# Patient Record
Sex: Female | Born: 1975
Health system: Southern US, Community
[De-identification: ages and names within clinical notes are randomized; demographics above are authoritative.]

## PROBLEM LIST (undated history)

## (undated) DIAGNOSIS — B009 Herpesviral infection, unspecified: Secondary | ICD-10-CM

## (undated) DIAGNOSIS — G894 Chronic pain syndrome: Secondary | ICD-10-CM

## (undated) DIAGNOSIS — E282 Polycystic ovarian syndrome: Secondary | ICD-10-CM

## (undated) DIAGNOSIS — K219 Gastro-esophageal reflux disease without esophagitis: Secondary | ICD-10-CM

## (undated) DIAGNOSIS — R253 Fasciculation: Secondary | ICD-10-CM

## (undated) DIAGNOSIS — E785 Hyperlipidemia, unspecified: Secondary | ICD-10-CM

## (undated) DIAGNOSIS — G4730 Sleep apnea, unspecified: Secondary | ICD-10-CM

## (undated) DIAGNOSIS — F419 Anxiety disorder, unspecified: Secondary | ICD-10-CM

## (undated) DIAGNOSIS — M797 Fibromyalgia: Secondary | ICD-10-CM

## (undated) DIAGNOSIS — I10 Essential (primary) hypertension: Secondary | ICD-10-CM

## (undated) DIAGNOSIS — Z8489 Family history of other specified conditions: Secondary | ICD-10-CM

## (undated) HISTORY — DX: Hyperlipidemia, unspecified: E78.5

## (undated) HISTORY — PX: COLONOSCOPY: SHX174

## (undated) HISTORY — DX: Fasciculation: R25.3

## (undated) HISTORY — DX: Fibromyalgia: M79.7

## (undated) HISTORY — DX: Herpesviral infection, unspecified: B00.9

## (undated) HISTORY — PX: KNEE SURGERY: SHX244

## (undated) HISTORY — PX: DILATION AND CURETTAGE OF UTERUS: SHX78

## (undated) HISTORY — DX: Gastro-esophageal reflux disease without esophagitis: K21.9

## (undated) HISTORY — PX: MOUTH SURGERY: SHX715

---

## 2014-02-23 ENCOUNTER — Other Ambulatory Visit: Payer: Self-pay | Admitting: Obstetrics & Gynecology

## 2014-02-23 ENCOUNTER — Other Ambulatory Visit (HOSPITAL_COMMUNITY)
Admission: RE | Admit: 2014-02-23 | Discharge: 2014-02-23 | Disposition: A | Discharge status: Home / Self Care | Payer: Federal, State, Local not specified - PPO | Source: Ambulatory Visit | Attending: Obstetrics & Gynecology | Admitting: Obstetrics & Gynecology

## 2014-02-23 DIAGNOSIS — Z01419 Encounter for gynecological examination (general) (routine) without abnormal findings: Secondary | ICD-10-CM | POA: Insufficient documentation

## 2014-02-23 DIAGNOSIS — Z1151 Encounter for screening for human papillomavirus (HPV): Secondary | ICD-10-CM | POA: Insufficient documentation

## 2015-02-19 ENCOUNTER — Other Ambulatory Visit (HOSPITAL_COMMUNITY)
Admission: RE | Admit: 2015-02-19 | Discharge: 2015-02-19 | Disposition: A | Discharge status: Home / Self Care | Payer: Federal, State, Local not specified - PPO | Source: Ambulatory Visit | Attending: Obstetrics & Gynecology | Admitting: Obstetrics & Gynecology

## 2015-02-19 ENCOUNTER — Other Ambulatory Visit: Payer: Self-pay | Admitting: Obstetrics & Gynecology

## 2015-02-19 DIAGNOSIS — Z113 Encounter for screening for infections with a predominantly sexual mode of transmission: Secondary | ICD-10-CM | POA: Diagnosis present

## 2015-02-19 DIAGNOSIS — Z01419 Encounter for gynecological examination (general) (routine) without abnormal findings: Secondary | ICD-10-CM | POA: Insufficient documentation

## 2015-02-22 LAB — CYTOLOGY - PAP

## 2015-09-29 ENCOUNTER — Other Ambulatory Visit: Payer: Self-pay | Admitting: Family Medicine

## 2015-09-29 DIAGNOSIS — R1011 Right upper quadrant pain: Secondary | ICD-10-CM

## 2015-09-29 DIAGNOSIS — K8020 Calculus of gallbladder without cholecystitis without obstruction: Secondary | ICD-10-CM

## 2015-10-01 ENCOUNTER — Ambulatory Visit
Admission: RE | Admit: 2015-10-01 | Discharge: 2015-10-01 | Disposition: A | Discharge status: Home / Self Care | Payer: Federal, State, Local not specified - PPO | Source: Ambulatory Visit | Attending: Family Medicine | Admitting: Family Medicine

## 2015-10-01 DIAGNOSIS — K8020 Calculus of gallbladder without cholecystitis without obstruction: Secondary | ICD-10-CM

## 2015-10-01 DIAGNOSIS — R1011 Right upper quadrant pain: Secondary | ICD-10-CM

## 2016-05-03 ENCOUNTER — Encounter (HOSPITAL_BASED_OUTPATIENT_CLINIC_OR_DEPARTMENT_OTHER): Payer: Self-pay

## 2016-05-03 ENCOUNTER — Emergency Department (HOSPITAL_BASED_OUTPATIENT_CLINIC_OR_DEPARTMENT_OTHER)

## 2016-05-03 ENCOUNTER — Emergency Department (HOSPITAL_BASED_OUTPATIENT_CLINIC_OR_DEPARTMENT_OTHER)
Admission: EM | Admit: 2016-05-03 | Discharge: 2016-05-03 | Disposition: A | Discharge status: Home / Self Care | Attending: Emergency Medicine | Admitting: Emergency Medicine

## 2016-05-03 DIAGNOSIS — Y9389 Activity, other specified: Secondary | ICD-10-CM | POA: Diagnosis not present

## 2016-05-03 DIAGNOSIS — W1839XA Other fall on same level, initial encounter: Secondary | ICD-10-CM | POA: Diagnosis not present

## 2016-05-03 DIAGNOSIS — S8992XA Unspecified injury of left lower leg, initial encounter: Secondary | ICD-10-CM | POA: Diagnosis present

## 2016-05-03 DIAGNOSIS — Z79899 Other long term (current) drug therapy: Secondary | ICD-10-CM | POA: Diagnosis not present

## 2016-05-03 DIAGNOSIS — Z87891 Personal history of nicotine dependence: Secondary | ICD-10-CM | POA: Insufficient documentation

## 2016-05-03 DIAGNOSIS — M25562 Pain in left knee: Secondary | ICD-10-CM

## 2016-05-03 DIAGNOSIS — Y92520 Airport as the place of occurrence of the external cause: Secondary | ICD-10-CM | POA: Insufficient documentation

## 2016-05-03 DIAGNOSIS — Y990 Civilian activity done for income or pay: Secondary | ICD-10-CM | POA: Insufficient documentation

## 2016-05-03 HISTORY — DX: Polycystic ovarian syndrome: E28.2

## 2016-05-03 HISTORY — DX: Chronic pain syndrome: G89.4

## 2016-05-03 HISTORY — DX: Anxiety disorder, unspecified: F41.9

## 2016-05-03 MED ORDER — OXYCODONE-ACETAMINOPHEN 5-325 MG PO TABS: 1.0000 | ORAL_TABLET | ORAL | Status: DC | PRN

## 2016-05-03 MED ORDER — OXYCODONE-ACETAMINOPHEN 5-325 MG PO TABS: 1.0000 | ORAL_TABLET | Freq: Once | ORAL | Status: DC

## 2016-05-03 MED ORDER — IBUPROFEN 400 MG PO TABS: 400.0000 mg | ORAL_TABLET | Freq: Once | ORAL | Status: DC

## 2016-05-03 MED FILL — OXYCODONE/APAP 5-325: 5-325 | 1 days supply | Qty: 6 | Fill #0

## 2016-05-03 NOTE — ED Notes (Signed)
Returned from xray

## 2016-05-03 NOTE — ED Notes (Signed)
Left knee injury x 2 at work-first when lifting car seat-second when squatting to standing and heard a pop-hx of surgery to knee 8 yrs ago-presents to triage in w/c

## 2016-05-03 NOTE — Discharge Instructions (Signed)

## 2016-05-03 NOTE — ED Notes (Signed)
Patient transported to X-ray 

## 2016-05-09 ENCOUNTER — Emergency Department (HOSPITAL_BASED_OUTPATIENT_CLINIC_OR_DEPARTMENT_OTHER)
Admission: EM | Admit: 2016-05-09 | Discharge: 2016-05-09 | Disposition: A | Discharge status: Home / Self Care | Attending: Emergency Medicine | Admitting: Emergency Medicine

## 2016-05-09 ENCOUNTER — Encounter (HOSPITAL_BASED_OUTPATIENT_CLINIC_OR_DEPARTMENT_OTHER): Payer: Self-pay | Admitting: *Deleted

## 2016-05-09 DIAGNOSIS — Z87891 Personal history of nicotine dependence: Secondary | ICD-10-CM | POA: Diagnosis not present

## 2016-05-09 DIAGNOSIS — Y929 Unspecified place or not applicable: Secondary | ICD-10-CM | POA: Diagnosis not present

## 2016-05-09 DIAGNOSIS — X58XXXA Exposure to other specified factors, initial encounter: Secondary | ICD-10-CM | POA: Diagnosis not present

## 2016-05-09 DIAGNOSIS — Y990 Civilian activity done for income or pay: Secondary | ICD-10-CM | POA: Insufficient documentation

## 2016-05-09 DIAGNOSIS — S8992XD Unspecified injury of left lower leg, subsequent encounter: Secondary | ICD-10-CM | POA: Diagnosis not present

## 2016-05-09 DIAGNOSIS — Y939 Activity, unspecified: Secondary | ICD-10-CM | POA: Insufficient documentation

## 2016-05-09 DIAGNOSIS — M25562 Pain in left knee: Secondary | ICD-10-CM | POA: Diagnosis present

## 2016-05-09 NOTE — ED Notes (Signed)
Pt requests to see PA prior to D/c. Will make aware.

## 2016-05-09 NOTE — ED Notes (Signed)
[place]ie PA in to see pt.

## 2016-05-09 NOTE — ED Provider Notes (Signed)
CSN: [id number]     Arrival date & time 05/09/16  1031 History   First MD Initiated Contact with Patient 05/09/16 1043     No chief complaint on file.    (Consider location/radiation/quality/duration/timing/severity/associated sxs/prior Treatment) HPI   40 year old female with history of chronic pain syndrome, anxiety, presenting for evaluation of left knee pain. Patient injured her left knee at work a week ago when she went from a squatting position to a standing position heard a pop with acute pain to her left knee. She was seen in the ED and an x-ray of her knee shows no acute fractures or dislocation. She was recommended to follow-up with a specialist for further evaluation of her left knee injury as it could be an internal derangement. A worker's comp sheet was filled as well as a work note provided. Patient presenting today stating that she is having difficulty with follow-up by his specialist due to "insufficient information on the worker's comp" sheet.  She is requesting for the note to be filled to the specification of her work's request.  She continues to endorse sharp throbbing pain to L knee worsening with walking up the steps and with movement.  She report knee instability.  She has been using her knee immobilizer and crutches as prescribed.  She has not been evaluated by a specialist yet.  She has had L knee surgery in the past by Dr. Yisroel Ramming.  She denies pain to L hip or L ankle.  Pt report she developed reaction to the oxycodone prescribed during the past visits and currently taking OTC meds to alleviate her pain.     Past Medical History  Diagnosis Date  . PCOS (polycystic ovarian syndrome)   . Chronic pain syndrome   . Anxiety    Past Surgical History  Procedure Laterality Date  . Knee surgery    . Mouth surgery    . Dilation and curettage of uterus     No family history on file. Social History  Substance Use Topics  . Smoking status: Former Games developer  . Smokeless tobacco:  Not on file  . Alcohol Use: No   OB History    No data available     Review of Systems  Constitutional: Negative for fever.  Musculoskeletal: Positive for arthralgias.  Skin: Negative for rash and wound.  Neurological: Negative for numbness.      Allergies  Review of patient's allergies indicates no known allergies.  Home Medications   Prior to Admission medications   Medication Sig Start Date End Date Taking? Authorizing Provider  Carisoprodol (SOMA PO) Take by mouth.    Historical Provider, MD  ClonazePAM (KLONOPIN PO) Take by mouth.    Historical Provider, MD  oxyCODONE-acetaminophen (PERCOCET/ROXICET) 5-325 MG tablet Take 1 tablet by mouth every 4 (four) hours as needed for severe pain. 05/03/16   Raeford Razor, MD  TIZANIDINE HCL PO Take by mouth.    Historical Provider, MD   BP 133/67 mmHg  Pulse 91  Temp(Src) 98.6 F (37 C) (Oral)  Resp 18  Ht  (1.702 m)  Wt 108.863 kg  BMI 37.58 kg/m2  SpO2 98% Physical Exam  Constitutional: She appears well-developed and well-nourished. No distress.  HENT:  Head: Atraumatic.  Eyes: Conjunctivae are normal.  Neck: Neck supple.  Cardiovascular: Intact distal pulses.   Musculoskeletal: She exhibits tenderness (L knee: tendernss to medial and lateral joint line on palpation.  negative anterior/posterior drawer test.  pain with varus/valgus maneuver.  no swelling  noted and no deformity. ).  Neurological: She is alert.  Skin: No rash noted.  Psychiatric: She has a normal mood and affect.  Nursing note and vitals reviewed.   ED Course  Procedures (including critical care time)   MDM   Final diagnoses:  Left knee injury, subsequent encounter    BP 133/67 mmHg  Pulse 91  Temp(Src) 98.6 F (37 C) (Oral)  Resp 18  Ht 5\' 7"  (1.702 m)  Wt 108.863 kg  BMI 37.58 kg/m2  SpO2 98%   11:09 AM Pt here requesting for me to talk to her HR and to fill out the appropriate form in order for her to get further care for her  recent L knee injury.  Her xray was negative.  Suspect internal derangement of the knee.  I did talk to her HR person on the phone.  A form was faxed to me to fill, specify her work restriction.  Pt can work in a seated position for a duration of 1 week and she will need to f/u with an orthopedist specialist for further management of care.  Care discussed with Dr. Verdie MosherLiu.  Fayrene HelperBowie Josey Forcier, PA-C 05/09/16 1205  Lavera Guiseana Duo Liu, MD 05/09/16 Windy [id number]

## 2016-05-09 NOTE — ED Notes (Addendum)
Was seen here before for same  On May 31 for work related injury to left knee. Is here now because she states paperwork and work note were not filled out correctly.Pt is very anxious.  Also c/o that left knee has excrutiating pain and arrives in knee immobilizer.

## 2016-05-09 NOTE — Discharge Instructions (Signed)
Please follow up with an orthopedist specialist for further evaluation of your left knee injury as this may be an internal derangement of your knee (meniscal tear, ligament injury, etc...).  Continue to use over the counter tylenol/ibuprofen for pain.  Use crutches and knee immobilization as provided.  Knee Pain Knee pain is a common problem. It can have many causes. The pain often goes away by following your doctor's home care instructions. Treatment for ongoing pain will depend on the cause of your pain. If your knee pain continues, more tests may be needed to diagnose your condition. Tests may include X-rays or other imaging studies of your knee. HOME CARE  Take medicines only as told by your doctor.  Rest your knee and keep it raised (elevated) while you are resting.  Do not do things that cause pain or make your pain worse.  Avoid activities where both feet leave the ground at the same time, such as running, jumping rope, or doing jumping jacks.  Apply ice to the knee area:  Put ice in a plastic bag.  Place a towel between your skin and the bag.  Leave the ice on for 20 minutes, 2-3 times a day.  Ask your doctor if you should wear an elastic knee support.  Sleep with a pillow under your knee.  Lose weight if you are overweight. Being overweight can make your knee hurt more.  Do not use any tobacco products, including cigarettes, chewing tobacco, or electronic cigarettes. If you need help quitting, ask your doctor. Smoking may slow the healing of any bone and joint problems that you may have. GET HELP IF:  Your knee pain does not stop, it changes, or it gets worse.  You have a fever along with knee pain.  Your knee gives out or locks up.  Your knee becomes more swollen. GET HELP RIGHT AWAY IF:   Your knee feels hot to the touch.  You have chest pain or trouble breathing.   This information is not intended to replace advice given to you by your health care provider.  Make sure you discuss any questions you have with your health care provider.   Document Released: 02/16/2009 Document Revised: 12/11/2014 Document Reviewed: 01/21/2014 Elsevier Interactive Patient Education [employer]2016 Elsevier Inc.

## 2016-05-15 NOTE — ED Provider Notes (Signed)
CSN: [id number]     Arrival date & time 05/03/16  1355 History   First MD Initiated Contact with Patient 05/03/16 1531     Chief Complaint  Patient presents with  . Knee Injury     (Consider location/radiation/quality/duration/timing/severity/associated sxs/prior Treatment) HPI   40 year old female with left knee pain. Injured it [place]. She reports that she was carrying a baby's car seat when part of it fell and struck her in the left knee. She had pain since that time but was able to continue working. Later she bent down to a crossing position and then as she stood back up she felt a "pop" in her left knee and said severe persistent knee pain since then to the point where she cannot fully bear weight. Denies any acute pain elsewhere. No numbness or tingling. Requesting Worker's Compensation paperwork be filled out. "My supervisor told me that this was the 'golden ticket'" while referring to this paperwork.   Past Medical History  Diagnosis Date  . PCOS (polycystic ovarian syndrome)   . Chronic pain syndrome   . Anxiety    Past Surgical History  Procedure Laterality Date  . Knee surgery    . Mouth surgery    . Dilation and curettage of uterus     No family history on file. Social History  Substance Use Topics  . Smoking status: Former Games developer  . Smokeless tobacco: None  . Alcohol Use: No   OB History    No data available     Review of Systems  All systems reviewed and negative, other than as noted in HPI.   Allergies  Review of patient's allergies indicates no known allergies.  Home Medications   Prior to Admission medications   Medication Sig Start Date End Date Taking? Authorizing Provider  Carisoprodol (SOMA PO) Take by mouth.   Yes Historical Provider, MD  ClonazePAM (KLONOPIN PO) Take by mouth.   Yes Historical Provider, MD  TIZANIDINE HCL PO Take by mouth.   Yes Historical Provider, MD  oxyCODONE-acetaminophen (PERCOCET/ROXICET)  5-325 MG tablet Take 1 tablet by mouth every 4 (four) hours as needed for severe pain. 05/03/16   Raeford Razor, MD   BP 130/86 mmHg  Pulse 84  Temp(Src) 99.1 F (37.3 C) (Oral)  Resp 18  Ht  (1.702 m)  Wt 240 lb (108.863 kg)  BMI 37.58 kg/m2  SpO2 100%  LMP  Physical Exam  Constitutional: She appears well-developed and well-nourished. No distress.  HENT:  Head: Normocephalic and atraumatic.  Eyes: Conjunctivae are normal. Right eye exhibits no discharge. Left eye exhibits no discharge.  Neck: Neck supple.  Cardiovascular: Normal rate, regular rhythm and normal heart sounds.  Exam reveals no gallop and no friction rub.   No murmur heard. Pulmonary/Chest: Effort normal and breath sounds normal. No respiratory distress.  Abdominal: Soft. She exhibits no distension. There is no tenderness.  Musculoskeletal: She exhibits no tenderness.  L knee unremarkable in appearance. No swelling/effusion. Diffusely tender. Increased pain with ROM. NVI.   Neurological: She is alert.  Skin: Skin is warm and dry.  Psychiatric: She has a normal mood and affect. Her behavior is normal. Thought content normal.  Nursing note and vitals reviewed.   ED Course  Procedures (including critical care time) Labs Review Labs Reviewed - No data to display  Imaging Review No results found.   Dg Knee Complete 4 Views Left  05/03/2016  CLINICAL DATA:  Left knee pain  after injury at work. Unable to bear weight. EXAM: LEFT KNEE - COMPLETE 4+ VIEW COMPARISON:  None. FINDINGS: Minimal degenerative changes most prominent over the medial compartment. No evidence of acute fracture dislocation. No significant joint effusion. IMPRESSION: No acute findings. Electronically Signed   By: Elberta Fortisaniel  Boyle M.D.   On: 05/03/2016 14:34   I have personally reviewed and evaluated these images and lab results as part of my medical decision-making.   EKG Interpretation None      MDM   Final diagnoses:  Left knee pain     40yF with L knee pain. Worker's comp. Imaging negative. NVI. I cannot r/o internal derangement. Placed in immobilizer. Crutches. Ortho FU. I am fine with her working in a seated position.     Raeford RazorStephen Kingslee Mairena, MD 05/15/16 [phone]

## 2017-02-27 ENCOUNTER — Other Ambulatory Visit: Payer: Self-pay | Admitting: Obstetrics & Gynecology

## 2017-03-21 ENCOUNTER — Encounter (HOSPITAL_BASED_OUTPATIENT_CLINIC_OR_DEPARTMENT_OTHER): Payer: Self-pay | Admitting: Emergency Medicine

## 2017-03-21 ENCOUNTER — Emergency Department (HOSPITAL_BASED_OUTPATIENT_CLINIC_OR_DEPARTMENT_OTHER)
Admission: EM | Admit: 2017-03-21 | Discharge: 2017-03-21 | Disposition: A | Discharge status: Home / Self Care | Payer: Federal, State, Local not specified - PPO | Attending: Emergency Medicine | Admitting: Emergency Medicine

## 2017-03-21 DIAGNOSIS — Z79899 Other long term (current) drug therapy: Secondary | ICD-10-CM | POA: Insufficient documentation

## 2017-03-21 DIAGNOSIS — H9201 Otalgia, right ear: Secondary | ICD-10-CM | POA: Diagnosis present

## 2017-03-21 DIAGNOSIS — Z87891 Personal history of nicotine dependence: Secondary | ICD-10-CM | POA: Insufficient documentation

## 2017-03-21 DIAGNOSIS — H60311 Diffuse otitis externa, right ear: Secondary | ICD-10-CM | POA: Diagnosis not present

## 2017-03-21 MED ORDER — HYDROCODONE-ACETAMINOPHEN 5-325 MG PO TABS: 2.0000 | ORAL_TABLET | Freq: Four times a day (QID) | ORAL | 0 refills | Status: DC | PRN

## 2017-03-21 MED ORDER — CIPROFLOXACIN-DEXAMETHASONE 0.3-0.1 % OT SUSP: 4.0000 [drp] | Freq: Two times a day (BID) | OTIC | 0 refills | Status: DC

## 2017-03-21 NOTE — Discharge Instructions (Signed)
You may alternate Tylenol 1000 mg every 6 hours as needed for pain and Ibuprofen 800 mg every 8 hours as needed for pain.  Please take Ibuprofen with food.   Please note that Vicodin has 325 mg of Tylenol in each tablet. You should not take more than 4000 mg of Tylenol in a 24-hour period.

## 2017-03-21 NOTE — ED Notes (Signed)
c/o rt ear pain after plane flight on April 14, rt ear canal swollen  Diff hearing and painful,  denies drainage

## 2017-03-21 NOTE — ED Provider Notes (Signed)
TIME SEEN: 4:38 AM  CHIEF COMPLAINT: Right ear pain  HPI: Patient is a 41 year old female with history of PCO S who presents emergency department with right ear pain for the past 2 weeks. Progressively worsening. No drainage. Reports that her hearing some small full from this ear. No injury to the ear. States it all started after a plane ride. She has not been swimming recently. She is not a diabetic.  ROS: See HPI Constitutional: no fever  Eyes: no drainage  ENT: no runny nose   Cardiovascular:  no chest pain  Resp: no SOB  GI: no vomiting GU: no dysuria Integumentary: no rash  Allergy: no hives  Musculoskeletal: no leg swelling  Neurological: no slurred speech ROS otherwise negative  PAST MEDICAL HISTORY/PAST SURGICAL HISTORY:  Past Medical History:  Diagnosis Date  . Anxiety   . Chronic pain syndrome   . PCOS (polycystic ovarian syndrome)     MEDICATIONS:  Prior to Admission medications   Medication Sig Start Date End Date Taking? Authorizing Provider  Carisoprodol (SOMA PO) Take by mouth.    Historical Provider, MD  ClonazePAM (KLONOPIN PO) Take by mouth.    Historical Provider, MD  oxyCODONE-acetaminophen (PERCOCET/ROXICET) 5-325 MG tablet Take 1 tablet by mouth every 4 (four) hours as needed for severe pain. 05/03/16   Raeford Razor, MD  TIZANIDINE HCL PO Take by mouth.    Historical Provider, MD    ALLERGIES:  Allergies  Allergen Reactions  . Percocet [Oxycodone-Acetaminophen] Hives    SOCIAL HISTORY:  Social History  Substance Use Topics  . Smoking status: Former Games developer  . Smokeless tobacco: Never Used  . Alcohol use No    FAMILY HISTORY: No family history on file.  EXAM: BP 136/88 (BP Location: Left Arm)   Pulse 74   Temp 99.1 F (37.3 C) (Oral)   Resp 16   Ht  (1.702 m)   Wt 242 lb (109.8 kg)   SpO2 98%   BMI 37.90 kg/m  CONSTITUTIONAL: Alert and oriented and responds appropriately to questions. Well-appearing; well-nourished HEAD:  Normocephalic EYES: Conjunctivae clear, pupils appear equal, EOMI ENT: normal nose; moist mucous membranes; No pharyngeal erythema or petechiae, no tonsillar hypertrophy or exudate, no uvular deviation, no unilateral swelling, no trismus or drooling, no muffled voice, normal phonation, no stridor, no dental caries present, no drainable dental abscess noted, no Ludwig's angina, tongue sits flat in the bottom of the mouth, no angioedema, no facial erythema or warmth, no facial swelling; no pain with movement of the neck.  TMs are clear bilaterally without erythema, purulence, bulging, perforation, effusion.  No cerumen impaction or sign of foreign body in the external auditory canal. No inflammation, erythema or drainage from the external auditory canal on the left side but there is inflammation and erythema of the external auditory canal on the right. The canal is open however and not completely swollen shut. No signs of mastoiditis. No pain with manipulation of the pinna bilaterally. NECK: Supple, no meningismus, no nuchal rigidity, no LAD  CARD: RRR; S1 and S2 appreciated; no murmurs, no clicks, no rubs, no gallops RESP: Normal chest excursion without splinting or tachypnea; breath sounds clear and equal bilaterally; no wheezes, no rhonchi, no rales, no hypoxia or respiratory distress, speaking full sentences ABD/GI: Normal bowel sounds; non-distended; soft, non-tender, no rebound, no guarding, no peritoneal signs, no hepatosplenomegaly BACK:  The back appears normal and is non-tender to palpation, there is no CVA tenderness EXT: Normal ROM in all joints;  non-tender to palpation; no edema; normal capillary refill; no cyanosis, no calf tenderness or swelling    SKIN: Normal color for age and race; warm; no rash NEURO: Moves all extremities equally PSYCH: The patient's mood and manner are appropriate. Grooming and personal hygiene are appropriate.  MEDICAL DECISION MAKING: Patient here with otitis  externa. Will discharge with Ciprodex. Recommended alternating Tylenol and Motrin. We'll discharge with short course of Vicodin for further pain control. No sign of otitis media, perforation of her TM, mastoiditis. No sign of dental infection, pharyngitis, meningitis, deep space neck infection, PTA. Will she is safe to be discharged home. Given outpatient PCP and ENT follow-up as needed.   At this time, I do not feel there is any life-threatening condition present. I have reviewed and discussed all results (EKG, imaging, lab, urine as appropriate) and exam findings with patient/family. I have reviewed nursing notes and appropriate previous records.  I feel the patient is safe to be discharged home without further emergent workup and can continue workup as an outpatient as needed. Discussed usual and customary return precautions. Patient/family verbalize understanding and are comfortable with this plan.  Outpatient follow-up has been provided if needed. All questions have been answered.      Layla Maw Rayni Nemitz, DO 03/21/17 [phone]

## 2017-03-21 NOTE — ED Triage Notes (Signed)
Right ear pain x2 weeks after an airplane flight.  Has gotten progressively worse.  Ear canal now swollen shut and pain is around ear and radiating down neck.

## 2017-05-24 ENCOUNTER — Encounter: Payer: Self-pay | Admitting: Obstetrics & Gynecology

## 2017-05-24 ENCOUNTER — Ambulatory Visit (INDEPENDENT_AMBULATORY_CARE_PROVIDER_SITE_OTHER): Payer: Federal, State, Local not specified - PPO | Admitting: Obstetrics & Gynecology

## 2017-05-24 VITALS — BP 132/80 | Ht 67.0 in | Wt 235.0 lb

## 2017-05-24 DIAGNOSIS — Z975 Presence of (intrauterine) contraceptive device: Secondary | ICD-10-CM

## 2017-05-24 DIAGNOSIS — Z113 Encounter for screening for infections with a predominantly sexual mode of transmission: Secondary | ICD-10-CM

## 2017-05-24 DIAGNOSIS — Z01411 Encounter for gynecological examination (general) (routine) with abnormal findings: Secondary | ICD-10-CM | POA: Diagnosis not present

## 2017-05-24 DIAGNOSIS — N921 Excessive and frequent menstruation with irregular cycle: Secondary | ICD-10-CM | POA: Diagnosis not present

## 2017-05-24 NOTE — Progress Notes (Signed)
Paula Holder 07/11/1976 [id number]   History:    41 y.o.  G0  Boyfriend x 3 years  RP:  New patient presenting for annual gyn exam/BTB on IUD  HPI:  IUD x March 2018.  Continued BTB on it, most days with varied flow.  Had an EBx same day as IUD insertion 02/27/2017 showing Benign endometrium with breakdown and Polyp.  C/O pelvic cramping most days.  H/O Genital HSV on Valtrex prophylaxis.  Relationship is not good currently with her boyfriend.  Would like full STI screen.  No UTI Sx.  BMs normal.  Breasts wnl.  Past medical history,surgical history, family history and social history were all reviewed and documented in the EPIC chart.  Gynecologic History No LMP recorded. Contraception: IUD Last Pap: 2016. Results were: normal Last mammogram: None yet.  Obstetric History OB History  Gravida Para Term Preterm AB Living  0 0 0 0 0 0  SAB TAB Ectopic Multiple Live Births  0 0 0 0 0         ROS: A ROS was performed and pertinent positives and negatives are included in the history.  GENERAL: No fevers or chills. HEENT: No change in vision, no earache, sore throat or sinus congestion. NECK: No pain or stiffness. CARDIOVASCULAR: No chest pain or pressure. No palpitations. PULMONARY: No shortness of breath, cough or wheeze. GASTROINTESTINAL: No abdominal pain, nausea, vomiting or diarrhea, melena or bright red blood per rectum. GENITOURINARY: No urinary frequency, urgency, hesitancy or dysuria. MUSCULOSKELETAL: No joint or muscle pain, no back pain, no recent trauma. DERMATOLOGIC: No rash, no itching, no lesions. ENDOCRINE: No polyuria, polydipsia, no heat or cold intolerance. No recent change in weight. HEMATOLOGICAL: No anemia or easy bruising or bleeding. NEUROLOGIC: No headache, seizures, numbness, tingling or weakness. PSYCHIATRIC: No depression, no loss of interest in normal activity or change in sleep pattern.     Exam:   Ht 5\' 7"  (1.702 m)   Wt 235 lb (106.6 kg)   BMI 36.81  kg/m   Body mass index is 36.81 kg/m.  General appearance : Well developed well nourished female. No acute distress HEENT: Eyes: no retinal hemorrhage or exudates,  Neck supple, trachea midline, no carotid bruits, no thyroidmegaly Lungs: Clear to auscultation, no rhonchi or wheezes, or rib retractions  Heart: Regular rate and rhythm, no murmurs or gallops Breast:Examined in sitting and supine position were symmetrical in appearance, no palpable masses or tenderness,  no skin retraction, no nipple inversion, no nipple discharge, no skin discoloration, no axillary or supraclavicular lymphadenopathy Abdomen: no palpable masses or tenderness, no rebound or guarding Extremities: no edema or skin discoloration or tenderness  Pelvic:  Bartholin, Urethra, Skene Glands: Within normal limits             Vagina: No gross lesions or discharge  Cervix: No gross lesions or discharge.  IUD strings well seen.  No erythema, no polyp seen.  Cervix mildly tender to palpation.  Pap/HPV/Gono-Chlam.  Uterus  AV, normal size, shape and consistency, non-tender and mobile  Adnexa  Without masses or tenderness  Anus and perineum  normal    Assessment/Plan:  41 y.o. female for annual exam   1. Encounter for gynecological examination with abnormal finding Normal Gyn exam except for mild tenderness.  Breasts wnl.  Will schedule screening Mammo. - Pap IG, CT/NG NAA, and HPV (high risk)  2. Screen for STD (sexually transmitted disease)  - Pap IG, CT/NG NAA, and HPV (high risk) -  HIV antibody - RPR - Hepatitis C Antibody - Hepatitis B Surface AntiGEN  3. Breakthrough bleeding with IUD Had Endometrial Polyp on EBx 02/2017.  Will f/u to assess Endometrial line and pelvic organs with Pelvic US.  When we have all the results, will decide how best to manage the BTB.  If all investigation negative, will consider up to 3 packs of Estradiol/Progestin BCPs to control BTB.  Recommend NSAIDs to control cramping pain.   Declined patient's request for Narcotics. - US Transvaginal Non-OB; Future  Counseling on above issues >50% x 30 minutes  Genia Del MD, 3:22 PM 05/24/2017

## 2017-05-24 NOTE — Patient Instructions (Signed)
1. Encounter for gynecological examination with abnormal finding Normal Gyn exam except for mild tenderness.  Breasts wnl.  Will schedule screening Mammo. - Pap IG, CT/NG NAA, and HPV (high risk)  2. Screen for STD (sexually transmitted disease)  - Pap IG, CT/NG NAA, and HPV (high risk) - HIV antibody - RPR - Hepatitis C Antibody - Hepatitis B Surface AntiGEN  3. Breakthrough bleeding with IUD Had Endometrial Polyp on EBx 02/2017.  Will f/u to assess Endometrial line and pelvic organs with Pelvic US.  When we have all the results, will decide how best to manage the BTB.  If all investigation negative, will consider up to 3 packs of Estradiol/Progestin BCPs to control BTB.  Recommend NSAIDs to control cramping pain.  Declined patient's request for Narcotics. - US Transvaginal Non-OB; Future  York CeriseJaslyn, it was a pleasure to meet you today!  I will inform you of your results as soon as available.

## 2017-05-25 LAB — HEPATITIS B SURFACE ANTIGEN: Hepatitis B Surface Ag: NEGATIVE

## 2017-05-25 LAB — RPR

## 2017-05-25 LAB — HIV ANTIBODY (ROUTINE TESTING W REFLEX): HIV 1&2 Ab, 4th Generation: NONREACTIVE

## 2017-05-25 LAB — HEPATITIS C ANTIBODY: HCV Ab: NEGATIVE

## 2017-05-30 LAB — PAP IG, CT-NG NAA, HPV HIGH-RISK
Chlamydia Probe Amp: NOT DETECTED
GC PROBE AMP: NOT DETECTED
HPV DNA HIGH RISK: NOT DETECTED

## 2017-07-21 IMAGING — DX DG KNEE COMPLETE 4+V*L*
4 series · 4 of 4 positions shown · non-contrast
Comparison: None.

CLINICAL DATA: Left knee pain after injury at work. Unable to bear
weight.

EXAM:
LEFT KNEE - COMPLETE 4+ VIEW

[knee ap]
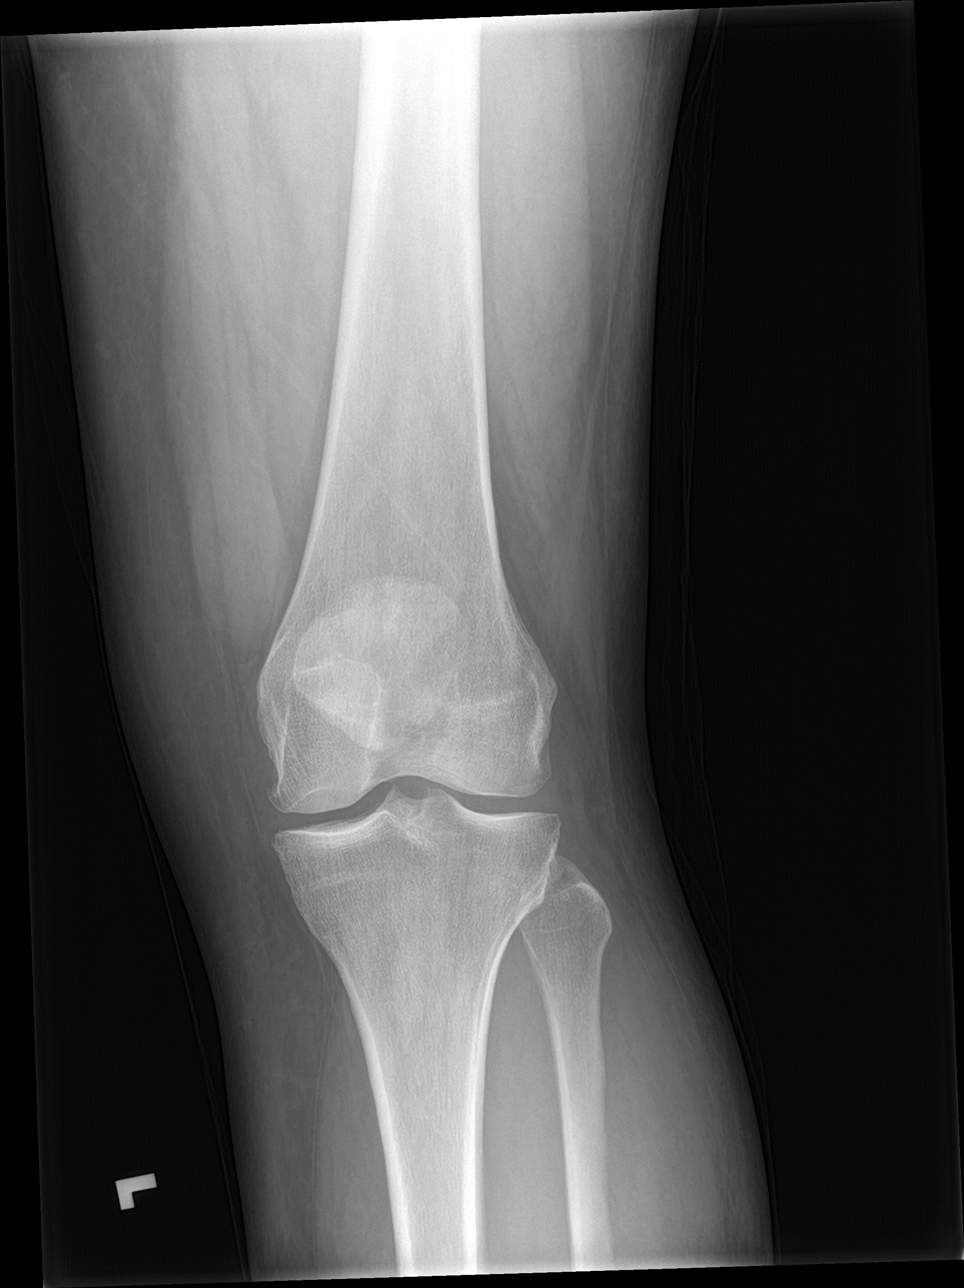

[knee lat]
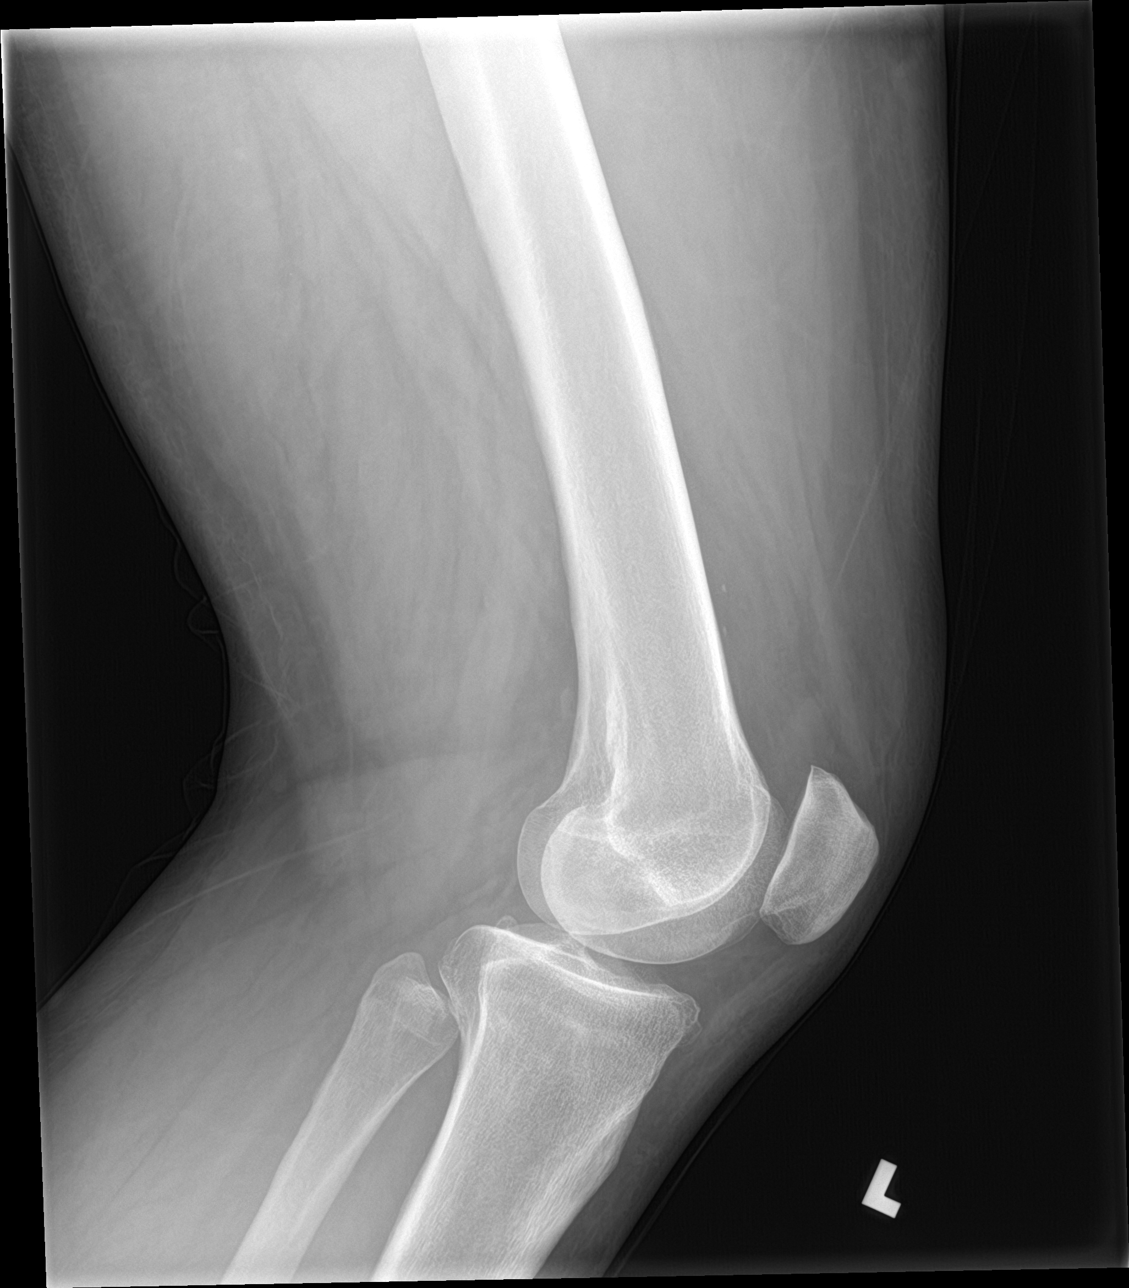

[knee obl (1 of 2)]
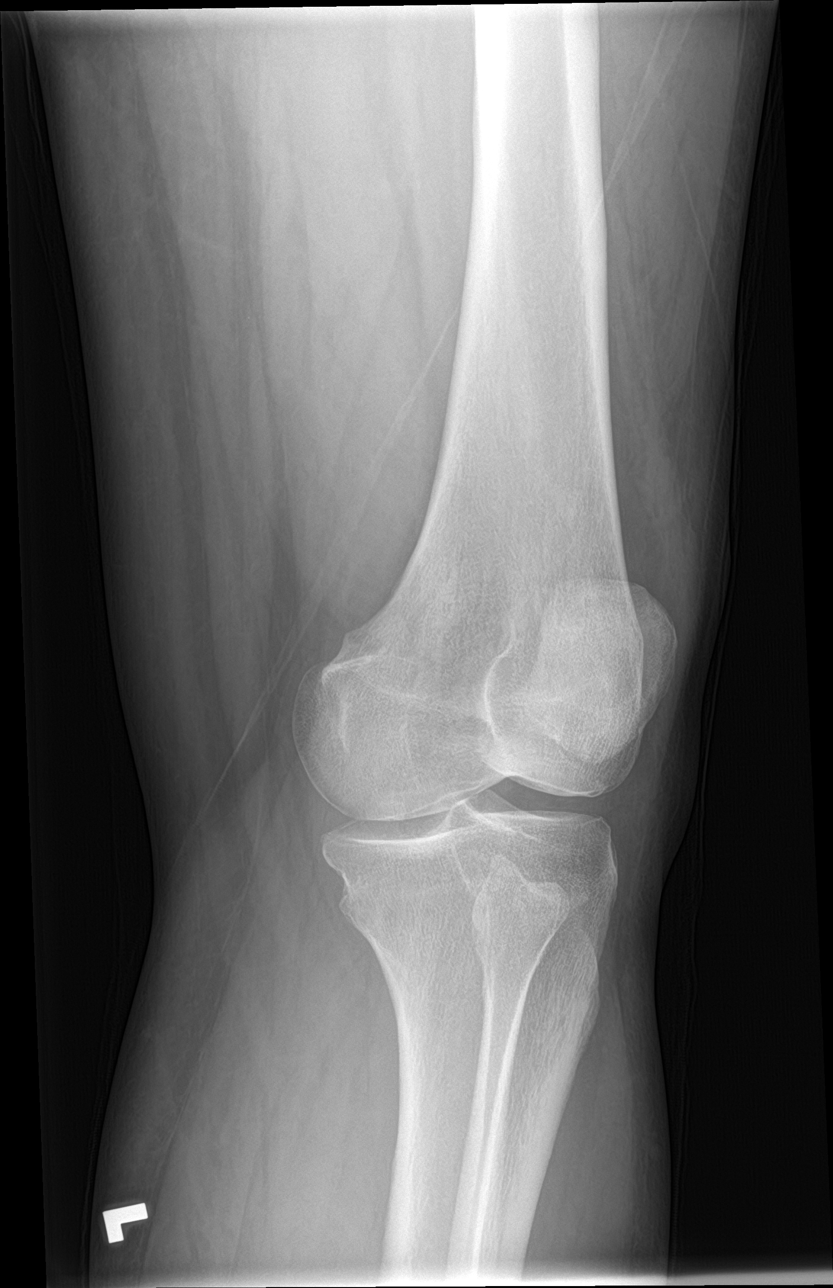

[knee obl (2 of 2)]
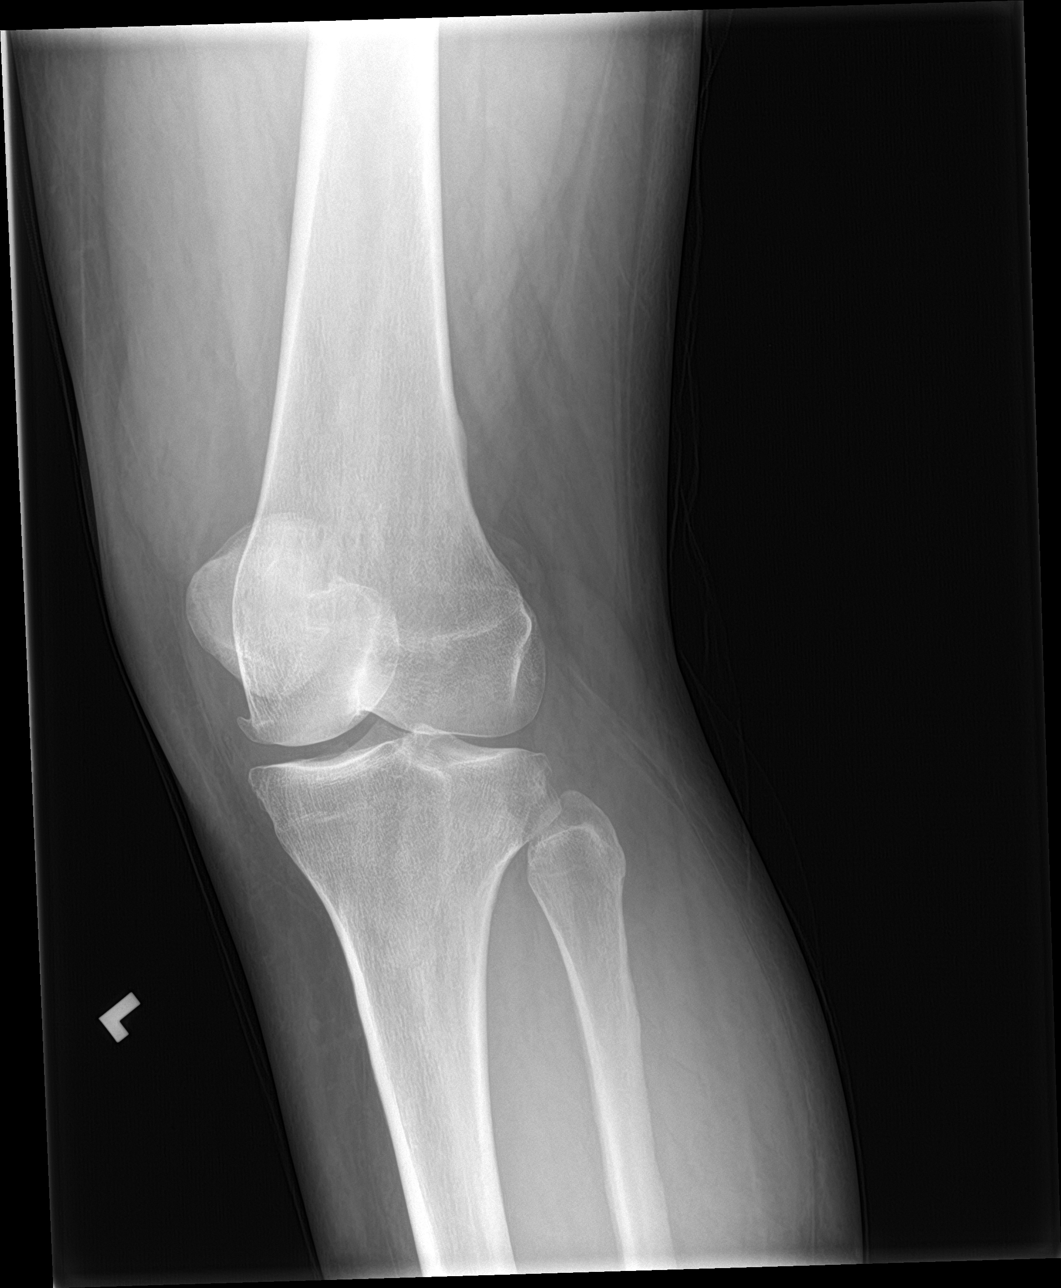

[4 of 4 positions shown; findings below may reference images not displayed]

FINDINGS: Minimal degenerative changes most prominent over the medial
compartment. No evidence of acute fracture dislocation. No
significant joint effusion.
IMPRESSION: No acute findings.

## 2018-01-21 ENCOUNTER — Telehealth: Payer: Self-pay | Admitting: *Deleted

## 2018-01-21 NOTE — Telephone Encounter (Addendum)
Spoke with pt, she states she has had issues with her IUD since it has been placed in back in 2018  Complains of pain during intercourse as well as she can feel the string and her spouse can too. Severity 5 to 6  We will schedule an appt with Provider for a IUD check

## 2018-01-22 ENCOUNTER — Encounter: Payer: Self-pay | Admitting: Obstetrics & Gynecology

## 2018-01-22 ENCOUNTER — Ambulatory Visit (INDEPENDENT_AMBULATORY_CARE_PROVIDER_SITE_OTHER): Payer: Federal, State, Local not specified - PPO | Admitting: Obstetrics & Gynecology

## 2018-01-22 VITALS — BP 132/80

## 2018-01-22 DIAGNOSIS — N941 Unspecified dyspareunia: Secondary | ICD-10-CM

## 2018-01-22 DIAGNOSIS — T8384XA Pain from genitourinary prosthetic devices, implants and grafts, initial encounter: Secondary | ICD-10-CM

## 2018-01-22 NOTE — Progress Notes (Signed)
    Paula MannJaslyn T Holder 11/04/1976 [id number]        42 y.o.  G0P0000 Single  RP: Pain with intercourse on Mirena IUD  HPI: Patient has discomfort with IC and boyfriend feels the strings poking at time of IC.  No abnormal vaginal discharge.  No pelvic pain outside sexual activity.  BTB improved.  Urine/BMs normal.  No fever.   OB History  Gravida Para Term Preterm AB Living  0 0 0 0 0 0  SAB TAB Ectopic Multiple Live Births  0 0 0 0 0        Past medical history,surgical history, problem list, medications, allergies, family history and social history were all reviewed and documented in the EPIC chart.   Directed ROS with pertinent positives and negatives documented in the history of present illness/assessment and plan.  Exam:  Vitals:   01/22/18 1344  BP: 132/80   General appearance:  Normal  Abdomen: Normal  Gynecologic exam: Vulva normal.  Speculum:  Cervix normal, strings about 3 cm long.  Vagina normal.  Secretions normal.  Strings trimmed at patient's request.  Bimanual exam:  Uterus AV, normal size, mobile, NT.  No adnexal mass felt, NT.   Assessment/Plan:  42 y.o. G0P0000   1. Pain due to intrauterine contraceptive device (IUD), initial encounter (HCC) Strings poking at the time of intercourse.  At patient's request, strings trimmed.  Patient was also having discomfort with intercourse but given no pain outside of sexual activity and a normal gynecologic exam today, decision to continue with the Mirena IUD at this time.  Alternative contraceptives discussed.  Counseling on above issues more than 50% for 25 minutes.  Paula DelMarie-Lyne Cadel Stairs MD, 2:09 PM 01/22/2018

## 2018-01-24 ENCOUNTER — Encounter: Payer: Self-pay | Admitting: Obstetrics & Gynecology

## 2018-01-24 ENCOUNTER — Telehealth: Payer: Self-pay | Admitting: *Deleted

## 2018-01-24 NOTE — Telephone Encounter (Signed)
I called and left message on pt voicemail to call me so I can give her to # to [facility] [phone] as they prefer to schedule with patient.

## 2018-01-24 NOTE — Telephone Encounter (Signed)
-----   Message from Genia DelMarie-Lyne Lavoie, MD sent at 01/22/2018  2:34 PM EST ----- Regarding: Suggest a psychotherapist 42 yo with difficulty making decisions for herself, always doing things for others.  Uncertain if wants contraception/cycle control or Fertility work-up to attempt conception.  Please help her find the best Psychotherapist to work on that.

## 2018-01-24 NOTE — Patient Instructions (Signed)
1. Pain due to intrauterine contraceptive device (IUD), initial encounter (HCC) Strings poking at the time of intercourse.  At patient's request, strings trimmed.  Patient was also having discomfort with intercourse but given no pain outside of sexual activity and a normal gynecologic exam today, decision to continue with the Mirena IUD at this time.  Alternative contraceptives discussed.  [name], good seeing you today!

## 2018-01-28 NOTE — Telephone Encounter (Signed)
Pt declined [facility], number given to Berniece AndreasJulie Whitt [phone] to call and schedule.

## 2018-02-15 ENCOUNTER — Encounter: Payer: Self-pay | Admitting: Obstetrics & Gynecology

## 2018-02-15 ENCOUNTER — Ambulatory Visit: Payer: Federal, State, Local not specified - PPO | Admitting: Obstetrics & Gynecology

## 2018-02-15 VITALS — BP 128/76

## 2018-02-15 DIAGNOSIS — R102 Pelvic and perineal pain: Secondary | ICD-10-CM | POA: Diagnosis not present

## 2018-02-15 DIAGNOSIS — Z30432 Encounter for removal of intrauterine contraceptive device: Secondary | ICD-10-CM | POA: Diagnosis not present

## 2018-02-15 DIAGNOSIS — N979 Female infertility, unspecified: Secondary | ICD-10-CM

## 2018-02-15 NOTE — Progress Notes (Signed)
    Paula Holder 05/28/1976 [id number]        42 y.o.  G0P0000 Stable boyfriend  RP: Pelvic pain/cramping with Mirena IUD  HPI: Strings trimmed last visit.  Had IC after that, boyfriend was fine, no pain from strings.  Patient already had some pelvic pain and cramping which worsened since last IC.  No abnormal d/c.  No vaginal bleeding.  No UTI Sx.  BMs wnl.  No fever.  Would like to remove IUD.  Would also like to conceive/investigate for H/O Primary infertility.   OB History  Gravida Para Term Preterm AB Living  0 0 0 0 0 0  SAB TAB Ectopic Multiple Live Births  0 0 0 0 0        Past medical history,surgical history, problem list, medications, allergies, family history and social history were all reviewed and documented in the EPIC chart.   Directed ROS with pertinent positives and negatives documented in the history of present illness/assessment and plan.  Exam:  Vitals:   02/15/18 1009  BP: 128/76   General appearance:  Normal  Abdomen: Normal soft  Gynecologic exam: Vulva normal.  Speculum:  Cervix normal, strings visible.  Vagina normal.  Normal secretions.  Strings grasped with a fenestrated clamp and IUD easily removed complete/intact.  Shown to patient and discarded.   Assessment/Plan:  42 y.o. G0P0000   1. Pelvic pain in female Mirena IUD removed.  Will observe without IUD.  If no resolution of pelvic pain, will proceed with a Pelvic US.  2. Encounter for IUD removal Easy removal of Mirena IUD.  No complication.  3. Primary female infertility H/O Primary Infertility with probable PCOS/chronic anovulation.  AMA at 42 yo.  Patient would like to investigate and be referred to a Fertility specialist after the basic work-up.  H/O Chlamydia.  Will do a HSG under Doxy.  F/U in 4 wks to reassess how she is doing without the IUD and to discuss HSG results, as well as to proceed with an Endocrine/Ovulation work-up and a Sperm analysis.  Counseling on above issues and  coordination of care >50% x 15 minutes.  Genia DelMarie-Lyne Zilphia Kozinski MD, 10:27 AM 02/15/2018

## 2018-02-15 NOTE — Patient Instructions (Signed)
1. Pelvic pain in female Mirena IUD removed.  Will observe without IUD.  If no resolution of pelvic pain, will proceed with a Pelvic US.  2. Encounter for IUD removal Easy removal of Mirena IUD.  No complication.  3. Primary female infertility H/O Primary Infertility with probable PCOS/chronic anovulation.  AMA at 42 yo.  Patient would like to investigate and be referred to a Fertility specialist after the basic work-up.  H/O Chlamydia.  Will do a HSG under Doxy.  F/U in 4 wks to reassess how she is doing without the IUD and to discuss HSG results, as well as to proceed with an Endocrine/Ovulation work-up and a Sperm analysis.  Humna, good seeing you today!  You will receive a phone call to organize the Hysterosalpingography.

## 2018-02-18 ENCOUNTER — Telehealth: Payer: Self-pay | Admitting: *Deleted

## 2018-02-18 DIAGNOSIS — N979 Female infertility, unspecified: Secondary | ICD-10-CM

## 2018-02-18 MED ORDER — DOXYCYCLINE HYCLATE 100 MG PO CAPS: ORAL_CAPSULE | ORAL | 0 refills | Status: DC

## 2018-02-18 NOTE — Telephone Encounter (Signed)
Left detailed message on cell per DRP access with time and date.

## 2018-02-18 NOTE — Telephone Encounter (Signed)
Pt scheduled on 02/22/18 @ 8:30am [facility], Rx for doxy. Left message for pt to call.

## 2018-02-18 NOTE — Telephone Encounter (Signed)
-----   Message from Genia DelMarie-Lyne Lavoie, MD sent at 02/15/2018 10:35 AM EDT ----- Regarding: Schedule Hysterosalpingography  Schedule asap.  Doxy 100 BID day before, of, and after HSG.

## 2018-02-20 ENCOUNTER — Telehealth: Payer: Self-pay | Admitting: *Deleted

## 2018-02-20 MED ORDER — NORETHINDRONE ACETATE 5 MG PO TABS: ORAL_TABLET | ORAL | 0 refills | Status: DC

## 2018-02-20 NOTE — Telephone Encounter (Signed)
Pt informed, Rx sent. 

## 2018-02-20 NOTE — Telephone Encounter (Signed)
Start Aygestin 5 mg/tab 1-2 tab daily until HSG is done.

## 2018-02-20 NOTE — Telephone Encounter (Signed)
Pt called was scheduled for HSG on 02/22/18, but had to reschedule due to bleeding. Pt had Mirena IUD removed on 02/15/18, bleeding started Monday, changing pads every 3 hours, cramping. Pt new HSG appointment is on 03/01/18 and she can't have bleeding to have exam. Pt asked if anything she can take to help with this?, as the bleeding doesn't appear to slow down. Please advise

## 2018-02-22 ENCOUNTER — Ambulatory Visit (HOSPITAL_COMMUNITY): Payer: Federal, State, Local not specified - PPO

## 2018-03-01 ENCOUNTER — Ambulatory Visit (HOSPITAL_COMMUNITY)
Admission: RE | Admit: 2018-03-01 | Discharge: 2018-03-01 | Disposition: A | Discharge status: Home / Self Care | Payer: Federal, State, Local not specified - PPO | Source: Ambulatory Visit | Attending: Obstetrics & Gynecology | Admitting: Obstetrics & Gynecology

## 2018-03-01 DIAGNOSIS — N979 Female infertility, unspecified: Secondary | ICD-10-CM | POA: Diagnosis not present

## 2018-03-01 MED ORDER — IOPAMIDOL (ISOVUE-300) INJECTION 61%
30.0000 mL | Freq: Once | INTRAVENOUS | Status: AC | PRN
  Administered 2018-03-01: 4 mL

## 2018-03-04 ENCOUNTER — Ambulatory Visit: Payer: Federal, State, Local not specified - PPO | Admitting: Licensed Clinical Social Worker

## 2018-03-04 DIAGNOSIS — F4310 Post-traumatic stress disorder, unspecified: Secondary | ICD-10-CM | POA: Diagnosis not present

## 2018-03-18 ENCOUNTER — Ambulatory Visit: Payer: Federal, State, Local not specified - PPO | Admitting: Obstetrics & Gynecology

## 2018-03-18 ENCOUNTER — Encounter: Payer: Self-pay | Admitting: Obstetrics & Gynecology

## 2018-03-18 VITALS — BP 130/82

## 2018-03-18 DIAGNOSIS — N979 Female infertility, unspecified: Secondary | ICD-10-CM | POA: Diagnosis not present

## 2018-03-18 DIAGNOSIS — N911 Secondary amenorrhea: Secondary | ICD-10-CM | POA: Diagnosis not present

## 2018-03-18 MED ORDER — MEDROXYPROGESTERONE ACETATE 5 MG PO TABS: 5.0000 mg | ORAL_TABLET | Freq: Every day | ORAL | 0 refills | Status: DC

## 2018-03-18 MED ORDER — CLOMIPHENE CITRATE 50 MG PO TABS: 100.0000 mg | ORAL_TABLET | Freq: Every day | ORAL | 0 refills | Status: AC

## 2018-03-18 NOTE — Patient Instructions (Signed)
1. Secondary amenorrhea Probable PCOS.  Will rule out thyroid dysfunction and hyperprolactinemia.  Verify her anti-mllerian hormone level given advanced maternal age at [age].  Will do progesterone day 21 while on Clomid. - TSH - Prolactin - Anti mullerian hormone - Progesterone; Future  2. Primary female infertility Advanced maternal age at 89 with history of PCOS.  Patient informed of the high risk of not succeeding to conceive.  Also informed of the high risk of miscarriage.  And finally the higher risk of chromosomal abnormalities such as Down syndrome reviewed.  Patient declines referral to os fertility specialist.  Recent hysterosalpingography showing bilateral patent tubes.  Desires attempting conception in spite of the low commitment of her boyfriend.  Timed intercourse discussed with patient, which will be difficult given that her boyfriend agrees to have sex only during the weekends.  Decision to refer to psychiatry for evaluation and management of many psychological issues. - TSH - Prolactin - Anti mullerian hormone - Progesterone; Future  Other orders - medroxyPROGESTERone (PROVERA) 5 MG tablet; Take 1 tablet (5 mg total) by mouth daily for 7 days. - clomiPHENE (CLOMID) 50 MG tablet; Take 2 tablets (100 mg total) by mouth daily for 5 days. Day #3 to 7th of cycle  Jace, good seeing you today!

## 2018-03-18 NOTE — Progress Notes (Signed)
    Paula Holder 06/22/1976 [id number]        42 y.o.  G0 Boyfriend  RP: Primary infertility with history of PCOS and advanced maternal age  HPI: Mirena IUD removed last visit on February 15, 2018.  Had mild vaginal bleeding following removal of IUD.  No bleeding since.  No pelvic pain.  History of primary infertility with PCOs for which she was given Clomid which regulated her periods.  History of chlamydia.  Hysterosalpingography done on March 01, 2018 showed a normal intrauterine cavity with normal patent tubes bilaterally.  Boyfriend does not seem to be committed to the relationship.  Per patient, he agrees to have intercourse only during weekends.  Patient is not sure whether he is monogamous or not.  Patient referred for psychotherapy recently, recommendation made to pursue behavioral therapy but patient declined.  No evidence of major depression.   OB History  Gravida Para Term Preterm AB Living  0 0 0 0 0 0  SAB TAB Ectopic Multiple Live Births  0 0 0 0 0    Past medical history,surgical history, problem list, medications, allergies, family history and social history were all reviewed and documented in the EPIC chart.   Directed ROS with pertinent positives and negatives documented in the history of present illness/assessment and plan.  Exam:  Vitals:   03/18/18 1458  BP: 130/82   General appearance:  Normal  HSG 03/01/2018:  Normal IU cavity, bilateral patent normal tubes with peritoneal spillage.   Assessment/Plan:  42 y.o. G0P0000   1. Secondary amenorrhea Probable PCOS.  Will rule out thyroid dysfunction and hyperprolactinemia.  Verify her anti-mllerian hormone level given advanced maternal age at [age].  Will do progesterone day 21 while on Clomid. - TSH - Prolactin - Anti mullerian hormone - Progesterone; Future  2. Primary female infertility Advanced maternal age at [age] with history of PCOS.  Patient informed of the high risk of not succeeding to conceive.  Also  informed of the high risk of miscarriage.  And finally the higher risk of chromosomal abnormalities such as Down syndrome reviewed.  Patient declines referral to os fertility specialist.  Recent hysterosalpingography showing bilateral patent tubes.  Desires attempting conception in spite of the low commitment of her boyfriend.  Timed intercourse discussed with patient, which will be difficult given that her boyfriend agrees to have sex only during the weekends.  Decision to refer to psychiatry for evaluation and management of many psychological issues. - TSH - Prolactin - Anti mullerian hormone - Progesterone; Future  Other orders - medroxyPROGESTERone (PROVERA) 5 MG tablet; Take 1 tablet (5 mg total) by mouth daily for 7 days. - clomiPHENE (CLOMID) 50 MG tablet; Take 2 tablets (100 mg total) by mouth daily for 5 days. Day #3 to 7th of cycle  Counseling on above issues and coordination of care more than 50% for 25 minutes.  Genia DelMarie-Lyne Deep Bonawitz MD, 3:03 PM 03/18/2018

## 2018-03-19 ENCOUNTER — Telehealth: Payer: Self-pay | Admitting: *Deleted

## 2018-03-19 NOTE — Telephone Encounter (Signed)
Prior authorization for clomiphene citrate 50 mg tablet approved via cover my [email or web address] until 09/15/2018, pharmacy informed as well.

## 2018-03-21 LAB — TSH: TSH: 3.66 m[IU]/L

## 2018-03-21 LAB — ANTI-MULLERIAN HORMONE (AMH), FEMALE: Anti-Mullerian Hormones(AMH), Female: 2.13 ng/mL (ref 0.01–2.99)

## 2018-03-21 LAB — PROLACTIN: Prolactin: 8.9 ng/mL

## 2018-03-25 ENCOUNTER — Telehealth: Payer: Self-pay | Admitting: *Deleted

## 2018-03-25 DIAGNOSIS — F909 Attention-deficit hyperactivity disorder, unspecified type: Secondary | ICD-10-CM

## 2018-03-25 NOTE — Telephone Encounter (Signed)
-----   Message from Genia DelMarie-Lyne Lavoie, MD sent at 03/18/2018 10:40 PM EDT ----- Regarding: Refer to psychiatry ADHD and other psychological issues for evaluation and management.

## 2018-03-25 NOTE — Telephone Encounter (Signed)
Left message on voicemail at [phone] to call me to confirm they work from [email or web address] to see referral in epic

## 2018-04-03 NOTE — Telephone Encounter (Signed)
Patient informed with time and date on 05/17/18 @ 9:00am with Dr.Arfeen on [address]

## 2018-04-06 ENCOUNTER — Other Ambulatory Visit: Payer: Self-pay | Admitting: Obstetrics & Gynecology

## 2018-05-06 ENCOUNTER — Other Ambulatory Visit: Payer: Self-pay | Admitting: Surgery

## 2018-05-06 ENCOUNTER — Ambulatory Visit: Payer: Federal, State, Local not specified - PPO | Admitting: Psychology

## 2018-05-06 DIAGNOSIS — R109 Unspecified abdominal pain: Secondary | ICD-10-CM

## 2018-05-06 DIAGNOSIS — F4310 Post-traumatic stress disorder, unspecified: Secondary | ICD-10-CM | POA: Diagnosis not present

## 2018-05-17 ENCOUNTER — Ambulatory Visit (HOSPITAL_COMMUNITY): Payer: Self-pay | Admitting: Psychiatry

## 2018-05-23 ENCOUNTER — Ambulatory Visit: Payer: Federal, State, Local not specified - PPO | Admitting: Psychology

## 2018-05-23 DIAGNOSIS — F4310 Post-traumatic stress disorder, unspecified: Secondary | ICD-10-CM | POA: Diagnosis not present

## 2018-05-27 ENCOUNTER — Ambulatory Visit
Admission: RE | Admit: 2018-05-27 | Discharge: 2018-05-27 | Disposition: A | Discharge status: Home / Self Care | Payer: Federal, State, Local not specified - PPO | Source: Ambulatory Visit | Attending: Surgery | Admitting: Surgery

## 2018-05-27 DIAGNOSIS — R109 Unspecified abdominal pain: Secondary | ICD-10-CM

## 2018-06-13 ENCOUNTER — Ambulatory Visit (HOSPITAL_COMMUNITY): Payer: Self-pay | Admitting: Psychiatry

## 2018-06-19 ENCOUNTER — Ambulatory Visit: Payer: Federal, State, Local not specified - PPO | Admitting: Psychology

## 2018-06-19 DIAGNOSIS — F4310 Post-traumatic stress disorder, unspecified: Secondary | ICD-10-CM | POA: Diagnosis not present

## 2018-06-28 ENCOUNTER — Ambulatory Visit: Payer: Federal, State, Local not specified - PPO | Admitting: Psychology

## 2018-06-28 DIAGNOSIS — F4310 Post-traumatic stress disorder, unspecified: Secondary | ICD-10-CM | POA: Diagnosis not present

## 2018-07-04 ENCOUNTER — Ambulatory Visit: Payer: Federal, State, Local not specified - PPO | Admitting: Psychology

## 2018-07-04 DIAGNOSIS — F4310 Post-traumatic stress disorder, unspecified: Secondary | ICD-10-CM | POA: Diagnosis not present

## 2018-07-11 ENCOUNTER — Ambulatory Visit: Payer: Federal, State, Local not specified - PPO | Admitting: Family Medicine

## 2018-07-11 ENCOUNTER — Encounter: Payer: Self-pay | Admitting: Family Medicine

## 2018-07-11 ENCOUNTER — Encounter

## 2018-07-11 VITALS — BP 142/94 | HR 106 | Temp 99.0°F | Ht 65.5 in | Wt 241.4 lb

## 2018-07-11 DIAGNOSIS — Z1239 Encounter for other screening for malignant neoplasm of breast: Secondary | ICD-10-CM

## 2018-07-11 DIAGNOSIS — L02211 Cutaneous abscess of abdominal wall: Secondary | ICD-10-CM | POA: Diagnosis not present

## 2018-07-11 DIAGNOSIS — L732 Hidradenitis suppurativa: Secondary | ICD-10-CM | POA: Diagnosis not present

## 2018-07-11 DIAGNOSIS — K921 Melena: Secondary | ICD-10-CM | POA: Insufficient documentation

## 2018-07-11 DIAGNOSIS — Z1231 Encounter for screening mammogram for malignant neoplasm of breast: Secondary | ICD-10-CM

## 2018-07-11 DIAGNOSIS — T8130XA Disruption of wound, unspecified, initial encounter: Secondary | ICD-10-CM

## 2018-07-11 DIAGNOSIS — R197 Diarrhea, unspecified: Secondary | ICD-10-CM | POA: Insufficient documentation

## 2018-07-11 MED ORDER — SULFAMETHOXAZOLE-TRIMETHOPRIM 800-160 MG PO TABS: 1.0000 | ORAL_TABLET | Freq: Two times a day (BID) | ORAL | 0 refills | Status: DC

## 2018-07-11 NOTE — Assessment & Plan Note (Addendum)
New- has been draining for weeks, non fluctuant and open so I and D is not warranted at this time. Wound cx obtained and sent. Place on oral Bactrim ds twice daily x 10 days. Discussed starting probiotics/yogourt with active cultures while taking it. Call or return to clinic prn if these symptoms worsen or fail to improve as anticipated. The patient indicates understanding of these issues and agrees with the plan.

## 2018-07-11 NOTE — Assessment & Plan Note (Signed)
Deteriorated- see AVS for suggestions to prevent and treat future flares.

## 2018-07-11 NOTE — Progress Notes (Signed)
Subjective:   Patient ID: Paula Holder, female    DOB: December 08, 1975, 42 y.o.   MRN: [id number]  Paula Holder is a pleasant 42 y.o. year old female who presents to clinic today with New Patient (Initial Visit) (Patient is here today to establish care.  Last PAP completed 6.27.18 @ GYN office WNL.  She said that she is no longer seeing that GYN.  She has a hole in her right lower abdomen that has been there for about 3-4 months now.  She would like for that to be looked at because it has gotten bigger and keeps busting open.  Agrees to get Tdap today. )  on 07/11/2018  HPI:  Patient is new to me.    Recurrent boils- usually gets them on her groin.  Does not think she has had one in her axilla.  She often pops them and they drain and resolve.  Her brother and sister get them too.  Now has one on her right lower abdomen that was much bigger, but has been draining for months.  Still an open hole that is draining.  Does feels she has a low grade temp.  Also has persistent, intermittent diarrhea for years.  Was told that gall bladder issues were ruled out.  Saw a surgeon (awaiting records) who told her she needed to see GI for persistent diarrhea.  Certain foods, like meat, seems to make symptoms worse.  Sometimes she does have blood in her stool.  She asking me for a referral for this today.   Current Outpatient Medications on File Prior to Visit  Medication Sig Dispense Refill  . TIZANIDINE HCL PO Take by mouth.    . valACYclovir (VALTREX) 500 MG tablet Take 500 mg by mouth 2 (two) times daily.     No current facility-administered medications on file prior to visit.     Allergies  Allergen Reactions  . Percocet [Oxycodone-Acetaminophen] Hives    Past Medical History:  Diagnosis Date  . Anxiety   . Chronic pain syndrome   . Fibromyalgia   . PCOS (polycystic ovarian syndrome)     Past Surgical History:  Procedure Laterality Date  . DILATION AND CURETTAGE OF UTERUS    . KNEE SURGERY     . MOUTH SURGERY      Family History  Problem Relation Age of Onset  . Hypertension Mother   . Cancer Maternal Grandmother        skin  . Diabetes Paternal Grandmother     Social History   Socioeconomic History  . Marital status: Single    Spouse name: Not on file  . Number of children: Not on file  . Years of education: Not on file  . Highest education level: Not on file  Occupational History  . Not on file  Social Needs  . Financial resource strain: Not on file  . Food insecurity:    Worry: Not on file    Inability: Not on file  . Transportation needs:    Medical: Not on file    Non-medical: Not on file  Tobacco Use  . Smoking status: Former Games developer  . Smokeless tobacco: Never Used  Substance and Sexual Activity  . Alcohol use: No  . Drug use: No  . Sexual activity: Yes    Partners: Male    Birth control/protection: IUD  Lifestyle  . Physical activity:    Days per week: Not on file    Minutes per session: Not on  file  . Stress: Not on file  Relationships  . Social connections:    Talks on phone: Not on file    Gets together: Not on file    Attends religious service: Not on file    Active member of club or organization: Not on file    Attends meetings of clubs or organizations: Not on file    Relationship status: Not on file  . Intimate partner violence:    Fear of current or ex partner: Not on file    Emotionally abused: Not on file    Physically abused: Not on file    Forced sexual activity: Not on file  Other Topics Concern  . Not on file  Social History Narrative  . Not on file   The PMH, PSH, Social History, Family History, Medications, and allergies have [facility], and have been updated if relevant.   Review of Systems  Constitutional: Positive for fever.  Respiratory: Negative.   Cardiovascular: Negative.   Gastrointestinal: Positive for anal bleeding and diarrhea. Negative for abdominal distention, abdominal pain, blood in stool,  constipation, nausea, rectal pain and vomiting.  Skin: Positive for wound.  Neurological: Negative.   Psychiatric/Behavioral: Negative.   All other systems reviewed and are negative.      Objective:    BP (!) 142/94 (BP Location: Left Arm, Cuff Size: Normal)   Pulse (!) 106   Temp 99 F (37.2 C) (Oral)   Ht 5' 5.5" (1.664 m)   Wt 241 lb 6.4 oz (109.5 kg)   LMP 05/13/2018 Comment: Irregular  SpO2 98%   BMI 39.56 kg/m    Physical Exam  Constitutional: She is oriented to person, place, and time. She appears well-developed and well-nourished. No distress.  HENT:  Head: Normocephalic and atraumatic.  Eyes: EOM are normal.  Neck: Normal range of motion.  Cardiovascular: Normal rate.  Pulmonary/Chest: Effort normal.  Abdominal: Soft.    Musculoskeletal: Normal range of motion. She exhibits no edema.  Neurological: She is alert and oriented to person, place, and time. No cranial nerve deficit.  Skin: Skin is warm. She is not diaphoretic.  Psychiatric: She has a normal mood and affect. Her behavior is normal. Judgment and thought content normal.  Nursing note and vitals reviewed.         Assessment & Plan:   Abdominal wound dehiscence, initial encounter - Plan: Wound culture  Abscess of skin of abdomen  Hydradenitis  Breast cancer screening - Plan: MM Digital Screening  Diarrhea, unspecified type - Plan: Ambulatory referral to Gastroenterology No follow-ups on file.

## 2018-07-11 NOTE — Patient Instructions (Addendum)
It was great to meet you.  Please take Bactrim as directed- 1 tablet twice daily x 10 days.  Please make an appointment to come see me in 2 weeks for a complete physical. Please call the breast center at [phone] to schedule your mammogram. I am also referring you to GI doctor.   Hidradenitis Suppurativa Hidradenitis suppurativa is a long-term (chronic) skin disease that starts with blocked sweat glands or hair follicles. Bacteria may grow in these blocked openings of your skin. Hidradenitis suppurativa is like a severe form of acne that develops in areas of your body where acne would be unusual. It is most likely to affect the areas of your body where skin rubs against skin and becomes moist. This includes your:  Underarms.  Groin.  Genital areas.  Buttocks.  Upper thighs.  Breasts.  Hidradenitis suppurativa may start out with small pimples. The pimples can develop into deep sores that break open (rupture) and drain pus. Over time your skin may thicken and become scarred. Hidradenitis suppurativa cannot be passed from person to person. What are the causes? The exact cause of hidradenitis suppurativa is not known. This condition may be due to:  Female and female hormones. The condition is rare before and after puberty.  An overactive body defense system (immune system). Your immune system may overreact to the blocked hair follicles or sweat glands and cause swelling and pus-filled sores.  What increases the risk? You may have a higher risk of hidradenitis suppurativa if you:  Are a woman.  Are between ages 7211 and 2255.  Have a family history of hidradenitis suppurativa.  Have a personal history of acne.  Are overweight.  Smoke.  Take the drug lithium.  What are the signs or symptoms? The first signs of an outbreak are usually painful skin bumps that look like pimples. As the condition progresses:  Skin bumps may get bigger and grow deeper into the skin.  Bumps  under the skin may rupture and drain smelly pus.  Skin may become itchy and infected.  Skin may thicken and scar.  Drainage may continue through tunnels under the skin (fistulas).  Walking and moving your arms can become painful.  How is this diagnosed? Your health care provider may diagnose hidradenitis suppurativa based on your medical history and your signs and symptoms. A physical exam will also be done. You may need to see a health care provider who specializes in skin diseases (dermatologist). You may also have tests done to confirm the diagnosis. These can include:  Swabbing a sample of pus or drainage from your skin so it can be sent to the lab and tested for infection.  Blood tests to check for infection.  How is this treated? The same treatment will not work for everybody with hidradenitis suppurativa. Your treatment will depend on how severe your symptoms are. You may need to try several treatments to find what works best for you. Part of your treatment may include cleaning and bandaging (dressing) your wounds. You may also have to take medicines, such as the following:  Antibiotics.  Acne medicines.  Medicines to block or suppress the immune system.  A diabetes medicine (metformin) is sometimes used to treat this condition.  For women, birth control pills can sometimes help relieve symptoms.  You may need surgery if you have a severe case of hidradenitis suppurativa that does not respond to medicine. Surgery may involve:  Using a laser to clear the skin and remove hair follicles.  Opening and draining deep sores.  Removing the areas of skin that are diseased and scarred.  Follow these instructions at home:  Learn as much as you can about your disease, and work closely with your health care providers.  Take medicines only as directed by your health care provider.  If you were prescribed an antibiotic medicine, finish it all even if you start to feel  better.  If you are overweight, losing weight may be very helpful. Try to reach and maintain a healthy weight.  Do not use any tobacco products, including cigarettes, chewing tobacco, or electronic cigarettes. If you need help quitting, ask your health care provider.  Do not shave the areas where you get hidradenitis suppurativa.  Do not wear deodorant.  Wear loose-fitting clothes.  Try not to overheat and get sweaty.  Take a daily bleach bath as directed by your health care provider. ? Fill your bathtub halfway with water. ? Pour in  cup of unscented household bleach. ? Soak for 5-10 minutes.  Cover sore areas with a warm, clean washcloth (compress) for 5-10 minutes. Contact a health care provider if:  You have a flare-up of hidradenitis suppurativa.  You have chills or a fever.  You are having trouble controlling your symptoms at home. This information is not intended to replace advice given to you by your health care provider. Make sure you discuss any questions you have with your health care provider. Document Released: 07/04/2004 Document Revised: 04/27/2016 Document Reviewed: 02/20/2014 Elsevier Interactive Patient Education  2018 [employer].

## 2018-07-11 NOTE — Assessment & Plan Note (Signed)
See below. Referral to GI placed- re:  Colonoscopy to rule out IBD, etc. The patient indicates understanding of these issues and agrees with the plan.

## 2018-07-11 NOTE — Assessment & Plan Note (Signed)
With intermittent bloody stool. Per pt, gall bladder "issues" rule out by surgery and was advised to see GI. Reasonable given duration of symptoms along with associated blood in stool- referral placed.

## 2018-07-14 LAB — WOUND CULTURE
MICRO NUMBER:: 90940358
SPECIMEN QUALITY:: ADEQUATE

## 2018-07-18 ENCOUNTER — Encounter: Payer: Self-pay | Admitting: Family Medicine

## 2018-07-18 ENCOUNTER — Ambulatory Visit: Payer: Federal, State, Local not specified - PPO | Admitting: Family Medicine

## 2018-07-18 VITALS — BP 140/82 | HR 105 | Temp 99.5°F | Ht 65.5 in | Wt 240.4 lb

## 2018-07-18 DIAGNOSIS — L732 Hidradenitis suppurativa: Secondary | ICD-10-CM

## 2018-07-18 MED ORDER — DOXYCYCLINE HYCLATE 100 MG PO TABS: 100.0000 mg | ORAL_TABLET | Freq: Two times a day (BID) | ORAL | 0 refills | Status: DC

## 2018-07-18 NOTE — Patient Instructions (Signed)
Hidradenitis Suppurativa Hidradenitis suppurativa is a long-term (chronic) skin disease that starts with blocked sweat glands or hair follicles. Bacteria may grow in these blocked openings of your skin. Hidradenitis suppurativa is like a severe form of acne that develops in areas of your body where acne would be unusual. It is most likely to affect the areas of your body where skin rubs against skin and becomes moist. This includes your:  Underarms.  Groin.  Genital areas.  Buttocks.  Upper thighs.  Breasts.  Hidradenitis suppurativa may start out with small pimples. The pimples can develop into deep sores that break open (rupture) and drain pus. Over time your skin may thicken and become scarred. Hidradenitis suppurativa cannot be passed from person to person. What are the causes? The exact cause of hidradenitis suppurativa is not known. This condition may be due to:  Female and female hormones. The condition is rare before and after puberty.  An overactive body defense system (immune system). Your immune system may overreact to the blocked hair follicles or sweat glands and cause swelling and pus-filled sores.  What increases the risk? You may have a higher risk of hidradenitis suppurativa if you:  Are a woman.  Are between ages 11 and 55.  Have a family history of hidradenitis suppurativa.  Have a personal history of acne.  Are overweight.  Smoke.  Take the drug lithium.  What are the signs or symptoms? The first signs of an outbreak are usually painful skin bumps that look like pimples. As the condition progresses:  Skin bumps may get bigger and grow deeper into the skin.  Bumps under the skin may rupture and drain smelly pus.  Skin may become itchy and infected.  Skin may thicken and scar.  Drainage may continue through tunnels under the skin (fistulas).  Walking and moving your arms can become painful.  How is this diagnosed? Your health care provider may  diagnose hidradenitis suppurativa based on your medical history and your signs and symptoms. A physical exam will also be done. You may need to see a health care provider who specializes in skin diseases (dermatologist). You may also have tests done to confirm the diagnosis. These can include:  Swabbing a sample of pus or drainage from your skin so it can be sent to the lab and tested for infection.  Blood tests to check for infection.  How is this treated? The same treatment will not work for everybody with hidradenitis suppurativa. Your treatment will depend on how severe your symptoms are. You may need to try several treatments to find what works best for you. Part of your treatment may include cleaning and bandaging (dressing) your wounds. You may also have to take medicines, such as the following:  Antibiotics.  Acne medicines.  Medicines to block or suppress the immune system.  A diabetes medicine (metformin) is sometimes used to treat this condition.  For women, birth control pills can sometimes help relieve symptoms.  You may need surgery if you have a severe case of hidradenitis suppurativa that does not respond to medicine. Surgery may involve:  Using a laser to clear the skin and remove hair follicles.  Opening and draining deep sores.  Removing the areas of skin that are diseased and scarred.  Follow these instructions at home:  Learn as much as you can about your disease, and work closely with your health care providers.  Take medicines only as directed by your health care provider.  If you were prescribed   an antibiotic medicine, finish it all even if you start to feel better.  If you are overweight, losing weight may be very helpful. Try to reach and maintain a healthy weight.  Do not use any tobacco products, including cigarettes, chewing tobacco, or electronic cigarettes. If you need help quitting, ask your health care provider.  Do not shave the areas where you  get hidradenitis suppurativa.  Do not wear deodorant.  Wear loose-fitting clothes.  Try not to overheat and get sweaty.  Take a daily bleach bath as directed by your health care provider. ? Fill your bathtub halfway with water. ? Pour in  cup of unscented household bleach. ? Soak for 5-10 minutes.  Cover sore areas with a warm, clean washcloth (compress) for 5-10 minutes. Contact a health care provider if:  You have a flare-up of hidradenitis suppurativa.  You have chills or a fever.  You are having trouble controlling your symptoms at home. This information is not intended to replace advice given to you by your health care provider. Make sure you discuss any questions you have with your health care provider. Document Released: 07/04/2004 Document Revised: 04/27/2016 Document Reviewed: 02/20/2014 Elsevier Interactive Patient Education  2018 Elsevier Inc.  

## 2018-07-18 NOTE — Assessment & Plan Note (Addendum)
Deteriorated Will change antibiotic from bactrim to doxycycline given anti-inflammatory effect of doxycycline.  Referral placed to dermatology Additional recommendations per AVS Given recurrence I would consider diabetes screening as well given PCOS, has appt with Dr. Dayton MartesAron next week and will be having labs at that time.

## 2018-07-18 NOTE — Progress Notes (Signed)
Paula MannJaslyn T Ramberg - 42 y.o. female MRN [id number]  Date of birth: 08/14/1976  Subjective Chief Complaint  Patient presents with  . Wound Infection    draining open wounds. Has boils that are multiplying and draining, one is black and painful.    HPI Paula Holder is a 42 y.o. female with history of anxiety, fibromyalgia, Hidradenitis and PCOS here today with complaint of boil.   History of HS and has had recurrent boils on her abdominal wall and groin area.  Areas tend to rupture spontaneously or she will "pop" them on her own.  Started on bactrim by Dr. Dayton MartesAron at recent appt and area on abdominal wall cultured.  Culture without growth however she continues to have worsening since that time.  Reports area has gotten larger and is more painful.  It continues to drain and she has noticed a few more areas that have appeared in her groin.  She denies fever/chills at home but temp in mildly elevated here today.  She does have occasional breast/underarm involvement as well.   ROS:  A comprehensive ROS was completed and negative except as noted per HPI  Allergies  Allergen Reactions  . Buspar [Buspirone]   . Percocet [Oxycodone-Acetaminophen] Hives    Past Medical History:  Diagnosis Date  . Anxiety   . Chronic pain syndrome   . Fibromyalgia   . PCOS (polycystic ovarian syndrome)     Past Surgical History:  Procedure Laterality Date  . DILATION AND CURETTAGE OF UTERUS    . KNEE SURGERY    . MOUTH SURGERY      Social History   Socioeconomic History  . Marital status: Single    Spouse name: Not on file  . Number of children: Not on file  . Years of education: Not on file  . Highest education level: Not on file  Occupational History  . Not on file  Social Needs  . Financial resource strain: Not on file  . Food insecurity:    Worry: Not on file    Inability: Not on file  . Transportation needs:    Medical: Not on file    Non-medical: Not on file  Tobacco Use  . Smoking status:  Former Games developermoker  . Smokeless tobacco: Never Used  Substance and Sexual Activity  . Alcohol use: No  . Drug use: No  . Sexual activity: Yes    Partners: Male    Birth control/protection: IUD  Lifestyle  . Physical activity:    Days per week: Not on file    Minutes per session: Not on file  . Stress: Not on file  Relationships  . Social connections:    Talks on phone: Not on file    Gets together: Not on file    Attends religious service: Not on file    Active member of club or organization: Not on file    Attends meetings of clubs or organizations: Not on file    Relationship status: Not on file  Other Topics Concern  . Not on file  Social History Narrative  . Not on file    Family History  Problem Relation Age of Onset  . Hypertension Mother   . Cancer Maternal Grandmother        skin  . Diabetes Paternal Grandmother     Health Maintenance  Topic Date Due  . Janet BerlinETANUS/TDAP  03/06/1996  . INFLUENZA VACCINE  07/04/2018  . PAP SMEAR  05/25/2020  . HIV Screening  Completed    -----------------------------------------------------------------------------------------------------------------------------------------------------------------------------------------------------------------  Physical Exam BP 140/82 (BP Location: Left Arm, Patient Position: Sitting, Cuff Size: Normal)   Pulse (!) 105   Temp 99.5 F (37.5 C) (Oral)   Ht 5' 5.5" (1.664 m)   Wt 240 lb 6.4 oz (109 kg)   SpO2 98%   BMI 39.40 kg/m   Physical Exam  Constitutional: She is oriented to person, place, and time. She appears well-nourished. No distress.  HENT:  Head: Normocephalic and atraumatic.  Mouth/Throat: Oropharynx is clear and moist.  Eyes: No scleral icterus.  Cardiovascular: Normal rate and regular rhythm.  Pulmonary/Chest: Effort normal and breath sounds normal.  Neurological: She is alert and oriented to person, place, and time.  Skin: Skin is warm and dry.  Slightly indurated ~1.5x2.5cm  area.  There are two areas of ulceration draining serous fluid  There is also a darkened area that appears to be bruising  Psychiatric: She has a normal mood and affect. Her behavior is normal.    ------------------------------------------------------------------------------------------------------------------------------------------------------------------------------------------------------------------- Assessment and Plan  Hidradenitis suppurativa Deteriorated Will change antibiotic from bactrim to doxycycline given anti-inflammatory effect of doxycycline.  Referral placed to dermatology Additional recommendations per AVS Given recurrence I would consider diabetes screening as well given PCOS, has appt with Dr. Dayton MartesAron next week and will be having labs at that time.

## 2018-07-19 ENCOUNTER — Encounter: Payer: Self-pay | Admitting: Family Medicine

## 2018-07-25 ENCOUNTER — Other Ambulatory Visit (HOSPITAL_COMMUNITY)
Admission: RE | Admit: 2018-07-25 | Discharge: 2018-07-25 | Disposition: A | Discharge status: Home / Self Care | Payer: Federal, State, Local not specified - PPO | Source: Ambulatory Visit | Attending: Family Medicine | Admitting: Family Medicine

## 2018-07-25 ENCOUNTER — Encounter: Payer: Self-pay | Admitting: Family Medicine

## 2018-07-25 ENCOUNTER — Ambulatory Visit (INDEPENDENT_AMBULATORY_CARE_PROVIDER_SITE_OTHER): Payer: Federal, State, Local not specified - PPO | Admitting: Family Medicine

## 2018-07-25 VITALS — BP 160/96 | HR 97 | Temp 98.3°F | Ht 65.5 in | Wt 240.6 lb

## 2018-07-25 DIAGNOSIS — L732 Hidradenitis suppurativa: Secondary | ICD-10-CM | POA: Diagnosis not present

## 2018-07-25 DIAGNOSIS — Z01419 Encounter for gynecological examination (general) (routine) without abnormal findings: Secondary | ICD-10-CM | POA: Diagnosis not present

## 2018-07-25 DIAGNOSIS — E669 Obesity, unspecified: Secondary | ICD-10-CM

## 2018-07-25 DIAGNOSIS — Z113 Encounter for screening for infections with a predominantly sexual mode of transmission: Secondary | ICD-10-CM | POA: Insufficient documentation

## 2018-07-25 DIAGNOSIS — Z23 Encounter for immunization: Secondary | ICD-10-CM | POA: Diagnosis not present

## 2018-07-25 DIAGNOSIS — Z975 Presence of (intrauterine) contraceptive device: Secondary | ICD-10-CM | POA: Insufficient documentation

## 2018-07-25 DIAGNOSIS — Z6839 Body mass index (BMI) 39.0-39.9, adult: Secondary | ICD-10-CM | POA: Diagnosis not present

## 2018-07-25 DIAGNOSIS — M797 Fibromyalgia: Secondary | ICD-10-CM | POA: Diagnosis not present

## 2018-07-25 DIAGNOSIS — E282 Polycystic ovarian syndrome: Secondary | ICD-10-CM | POA: Diagnosis not present

## 2018-07-25 DIAGNOSIS — N898 Other specified noninflammatory disorders of vagina: Secondary | ICD-10-CM | POA: Insufficient documentation

## 2018-07-25 DIAGNOSIS — L02211 Cutaneous abscess of abdominal wall: Secondary | ICD-10-CM | POA: Diagnosis not present

## 2018-07-25 DIAGNOSIS — Z114 Encounter for screening for human immunodeficiency virus [HIV]: Secondary | ICD-10-CM

## 2018-07-25 DIAGNOSIS — F419 Anxiety disorder, unspecified: Secondary | ICD-10-CM | POA: Diagnosis not present

## 2018-07-25 LAB — COMPREHENSIVE METABOLIC PANEL
ALK PHOS: 58 U/L (ref 39–117)
ALT: 26 U/L (ref 0–35)
AST: 22 U/L (ref 0–37)
Albumin: 4.1 g/dL (ref 3.5–5.2)
BILIRUBIN TOTAL: 0.6 mg/dL (ref 0.2–1.2)
BUN: 16 mg/dL (ref 6–23)
CALCIUM: 9.3 mg/dL (ref 8.4–10.5)
CO2: 28 mEq/L (ref 19–32)
Chloride: 105 mEq/L (ref 96–112)
Creatinine, Ser: 0.77 mg/dL (ref 0.40–1.20)
GFR: 87.64 mL/min (ref >60.00)
GLUCOSE: 85 mg/dL (ref 70–99)
Potassium: 4 mEq/L (ref 3.5–5.1)
Sodium: 140 mEq/L (ref 135–145)
TOTAL PROTEIN: 7.6 g/dL (ref 6.0–8.3)

## 2018-07-25 LAB — CBC
HEMATOCRIT: 37.3 % (ref 36.0–46.0)
HEMOGLOBIN: 12.8 g/dL (ref 12.0–15.0)
MCHC: 34.4 g/dL (ref 30.0–36.0)
MCV: 90 fl (ref 78.0–100.0)
Platelets: 244 10*3/uL (ref 150.0–400.0)
RBC: 4.14 Mil/uL (ref 3.87–5.11)
RDW: 13.1 % (ref 11.5–15.5)
WBC: 8.3 10*3/uL (ref 4.0–10.5)

## 2018-07-25 LAB — LIPID PANEL
Cholesterol: 153 mg/dL (ref 0–200)
HDL: 31.8 mg/dL — ABNORMAL LOW (ref >39.00)
NonHDL: 121.26
Total CHOL/HDL Ratio: 5
Triglycerides: 293 mg/dL — ABNORMAL HIGH (ref 0.0–149.0)
VLDL: 58.6 mg/dL — ABNORMAL HIGH (ref 0.0–40.0)

## 2018-07-25 LAB — TSH: TSH: 3.5 u[IU]/mL (ref 0.35–4.50)

## 2018-07-25 LAB — LDL CHOLESTEROL, DIRECT: Direct LDL: 97 mg/dL

## 2018-07-25 NOTE — Progress Notes (Signed)
Subjective:   Patient ID: Paula Holder, female    DOB: Aug 29, 1976, 42 y.o.   MRN: [id number]  Paula Holder is a pleasant 42 y.o. year old female who presents to clinic today with Annual Exam (pt here for annual exam, pt c/o sore muscles, pt sts her wound on abdomen area is not improving and tender. Pt last pap 05/30/2017 WNL due 05/25/2020, Pt needs a mammogram gave her the phone number and needs TDAP.  She is having vaginal discharge.)  on 07/25/2018  HPI:  Here for CPX. Established care with me me earlier this month- 07/11/18.  Note reviewed.  Last pap smear was normal and done on 05/30/17. Mammogram is scheduled for 08/13/18.  She has had some intermittent vaginal discharge.  Sexually active with her boyfriend only.  She does want STD screening today.  Abdominal skin abscess- When I saw her on 07/11/18, she had a boil on her abdomen- treated with Bactrim, send cx which did not grow any bacteria.   She then followed up with Dr. Ashley Royalty on 07/18/18- note reviewed.  She was having more boils appearing and the one that we treated was more painful.  She felt bactrim actually worsened the boils.  He placed her on doxycyline and referred her to dermatology. It did improve but still painful.  She is still waiting on dermatology appointment.    Current Outpatient Medications on File Prior to Visit  Medication Sig Dispense Refill  . doxycycline (VIBRA-TABS) 100 MG tablet Take 1 tablet (100 mg total) by mouth 2 (two) times daily. 20 tablet 0  . meloxicam (MOBIC) 7.5 MG tablet Take 7.5 mg by mouth daily.  1  . TIZANIDINE HCL PO Take by mouth.    . valACYclovir (VALTREX) 500 MG tablet Take 500 mg by mouth 2 (two) times daily.    . clonazePAM (KLONOPIN) 0.5 MG tablet     . QUEtiapine (SEROQUEL) 50 MG tablet TAKE 1 TABLET BY MOUTH EVERYDAY AT BEDTIME  0   No current facility-administered medications on file prior to visit.     Allergies  Allergen Reactions  . Buspar [Buspirone]   . Percocet  [Oxycodone-Acetaminophen] Hives    Past Medical History:  Diagnosis Date  . Anxiety   . Chronic pain syndrome   . Fibromyalgia   . PCOS (polycystic ovarian syndrome)     Past Surgical History:  Procedure Laterality Date  . DILATION AND CURETTAGE OF UTERUS    . KNEE SURGERY    . MOUTH SURGERY      Family History  Problem Relation Age of Onset  . Hypertension Mother   . Cancer Maternal Grandmother        skin  . Diabetes Paternal Grandmother     Social History   Socioeconomic History  . Marital status: Single    Spouse name: Not on file  . Number of children: Not on file  . Years of education: Not on file  . Highest education level: Not on file  Occupational History  . Not on file  Social Needs  . Financial resource strain: Not on file  . Food insecurity:    Worry: Not on file    Inability: Not on file  . Transportation needs:    Medical: Not on file    Non-medical: Not on file  Tobacco Use  . Smoking status: Former Games developer  . Smokeless tobacco: Never Used  Substance and Sexual Activity  . Alcohol use: No  . Drug use: No  .  Sexual activity: Yes    Partners: Male    Birth control/protection: IUD  Lifestyle  . Physical activity:    Days per week: Not on file    Minutes per session: Not on file  . Stress: Not on file  Relationships  . Social connections:    Talks on phone: Not on file    Gets together: Not on file    Attends religious service: Not on file    Active member of club or organization: Not on file    Attends meetings of clubs or organizations: Not on file    Relationship status: Not on file  . Intimate partner violence:    Fear of current or ex partner: Not on file    Emotionally abused: Not on file    Physically abused: Not on file    Forced sexual activity: Not on file  Other Topics Concern  . Not on file  Social History Narrative  . Not on file   The PMH, PSH, Social History, Family History, Medications, and allergies have been  reviewed in [facility], and have been updated if relevant.  Review of Systems  Constitutional: Negative.   HENT: Negative.   Eyes: Negative.   Respiratory: Negative.   Cardiovascular: Negative.   Gastrointestinal: Negative.   Endocrine: Negative.   Genitourinary: Positive for vaginal discharge.  Musculoskeletal: Negative.   Skin: Positive for wound.  Allergic/Immunologic: Negative.   Neurological: Negative.   Psychiatric/Behavioral: Negative.   All other systems reviewed and are negative.      Objective:    BP (!) 160/96 (BP Location: Right Arm, Patient Position: Sitting, Cuff Size: Normal)   Pulse 97   Temp 98.3 F (36.8 C) (Oral)   Ht 5' 5.5" (1.664 m)   Wt 240 lb 9.6 oz (109.1 kg)   LMP 05/13/2018   SpO2 97%   BMI 39.43 kg/m   BP Readings from Last 3 Encounters:  07/25/18 (!) 160/96  07/18/18 140/82  07/11/18 (!) 142/94    Physical Exam    General:  Well-developed,well-nourished,in no acute distress; alert,appropriate and cooperative throughout examination Head:  normocephalic and atraumatic.   Eyes:  vision grossly intact, PERRL Ears:  R ear normal and L ear normal externally, TMs clear bilaterally Nose:  no external deformity.   Mouth:  good dentition.   Neck:  No deformities, masses, or tenderness noted. Breasts:  No mass, nodules, thickening, tenderness, bulging, retraction, inflamation, nipple discharge or skin changes noted.   Lungs:  Normal respiratory effort, chest expands symmetrically. Lungs are clear to auscultation, no crackles or wheezes. Heart:  Normal rate and regular rhythm. S1 and S2 normal without gallop, murmur, click, rub or other extra sounds. Abdomen:  Abscess on abdomen no longer erythematous, non tender Rectal:  no external abnormalities.    Genitalia:  Pelvic Exam:        External: normal female genitalia without lesions or masses        Vagina: normal without lesions or masses        Cervix: normal without lesions or masses         Adnexa: normal bimanual exam without masses or fullness        Uterus: normal by palpation        Pap smear: performed Msk:  No deformity or scoliosis noted of thoracic or lumbar spine.   Extremities:  No clubbing, cyanosis, edema, or deformity noted with normal full range of motion of all joints.   Neurologic:  alert &  oriented X3 and gait normal.   Skin:  Intact without suspicious lesions or rashes Cervical Nodes:  No lymphadenopathy noted Axillary Nodes:  No palpable lymphadenopathy Psych:  Cognition and judgment appear intact. Alert and cooperative with normal attention span and concentration. No apparent delusions, illusions, hallucinations      Assessment & Plan:   Well woman exam with routine gynecological exam - Plan: Cytology - PAP  Hidradenitis suppurativa  Abscess of skin of abdomen - Plan: Comprehensive metabolic panel, CBC  Obesity (BMI 30-39.9) - Plan: Comprehensive metabolic panel, Lipid panel, TSH  Screening for HIV (human immunodeficiency virus) - Plan: HIV antibody (with reflex)  Vaginal discharge - Plan: Cytology - PAP  Need for Tdap vaccination - Plan: Tdap vaccine greater than or equal to 7yo IM  Screening examination for STD (sexually transmitted disease) - Plan: RPR No follow-ups on file.

## 2018-07-25 NOTE — Assessment & Plan Note (Signed)
Intermittent- no abnormal vaginal discharge detected on exam today.  ? Physiological discharge.   Genital swab sent for GC/Chalmydia, trichomonas and wet prep done with her pap smear. The patient indicates understanding of these issues and agrees with the plan.

## 2018-07-25 NOTE — Assessment & Plan Note (Addendum)
Persistent (improved from prior)- agree with dermatology referral. She is awaiting this appointment.

## 2018-07-25 NOTE — Patient Instructions (Addendum)
Great to see you. I will call you with your results from today and you can view them online.   [facility]. Please call the breast center at [phone] with any concerns or questions.   Someone will call you with your dermatology appointment.

## 2018-07-25 NOTE — Assessment & Plan Note (Signed)
Reviewed preventive care protocols, scheduled due services, and updated immunizations Discussed nutrition, exercise, diet, and healthy lifestyle.  Discussed USPSTF recommendations of cervical cancer screening.  She is aware that interval of 3 years is recommended but pt would prefer to have pap smear done today.  Labs today, tdap given.

## 2018-07-25 NOTE — Assessment & Plan Note (Signed)
Added HIV and RPR to her labs today. Genital swab sent for GC/Chalmydia, trichomonas with her pap smear.

## 2018-07-26 ENCOUNTER — Ambulatory Visit: Payer: Self-pay | Admitting: Psychology

## 2018-07-26 LAB — CYTOLOGY - PAP
Bacterial vaginitis: NEGATIVE
CANDIDA VAGINITIS: NEGATIVE
CHLAMYDIA, DNA PROBE: NEGATIVE
Diagnosis: NEGATIVE
HPV (WINDOPATH): NOT DETECTED
Neisseria Gonorrhea: NEGATIVE
TRICH (WINDOWPATH): NEGATIVE

## 2018-07-26 LAB — HIV ANTIBODY (ROUTINE TESTING W REFLEX): HIV: NONREACTIVE

## 2018-07-26 LAB — RPR: RPR Ser Ql: NONREACTIVE

## 2018-07-30 LAB — CERVICOVAGINAL ANCILLARY ONLY: Herpes: NEGATIVE

## 2018-08-13 ENCOUNTER — Ambulatory Visit
Admission: RE | Admit: 2018-08-13 | Discharge: 2018-08-13 | Disposition: A | Discharge status: Home / Self Care | Payer: Federal, State, Local not specified - PPO | Source: Ambulatory Visit | Attending: Family Medicine | Admitting: Family Medicine

## 2018-08-13 DIAGNOSIS — Z1239 Encounter for other screening for malignant neoplasm of breast: Secondary | ICD-10-CM

## 2018-12-05 ENCOUNTER — Other Ambulatory Visit: Payer: Self-pay | Admitting: Family Medicine

## 2018-12-05 NOTE — Telephone Encounter (Signed)
Copied from CRM [id number]. Topic: Quick Communication - Rx Refill/Question >> Dec 05, 2018  3:58 PM Baldo Daub L wrote: Medication: valACYclovir (VALTREX) 500 MG tablet  Has the patient contacted their pharmacy? Yes - was being Rxd by a doctor she no longer sees and pt would like to see if Dr. Dayton Martes will RX for her. (Agent: If no, request that the patient contact the pharmacy for the refill.) (Agent: If yes, when and what did the pharmacy advise?)  Preferred Pharmacy (with phone number or street name): [id number] [place], [place] - [address] [phone] (Phone) [phone] (Fax)  Agent: Please be advised that RX refills may take up to 3 business days. We ask that you follow-up with your pharmacy.

## 2018-12-06 NOTE — Telephone Encounter (Signed)
Requested medication (s) are due for refill today: yes  Requested medication (s) are on the active medication list: yes  Last refill:  05/24/17 historical provider  Future visit scheduled: no  Notes to clinic:  historical provider    Requested Prescriptions  Pending Prescriptions Disp Refills   valACYclovir (VALTREX) 500 MG tablet      Sig: Take 1 tablet (500 mg total) by mouth 2 (two) times daily.     Antimicrobials:  Antiviral Agents - Anti-Herpetic Passed - 12/06/2018  7:34 AM      Passed - Valid encounter within last 12 months    Recent Outpatient Visits          4 months ago Well woman exam with routine gynecological exam   [facility], Bryn Gulling, MD   4 months ago Hidradenitis suppurativa   [facility] [place], [place], DO   4 months ago Abdominal wound dehiscence, initial encounter   [facility] [address] [place]n Gulling, MD

## 2018-12-06 NOTE — Telephone Encounter (Signed)
TA-You have seen this pt twice and have taken over her care/you have not prescribed the Valacyclovir as of yet but this was an Rx that she was on prior to establishing care here/Plz advise if ok to send in? Thx dmf

## 2018-12-11 MED ORDER — VALACYCLOVIR HCL 500 MG PO TABS: 500.0000 mg | ORAL_TABLET | Freq: Two times a day (BID) | ORAL | 0 refills | Status: DC

## 2018-12-30 ENCOUNTER — Encounter: Payer: Self-pay | Admitting: Nurse Practitioner

## 2018-12-30 ENCOUNTER — Ambulatory Visit: Payer: Federal, State, Local not specified - PPO | Admitting: Nurse Practitioner

## 2018-12-30 VITALS — BP 124/72 | HR 102 | Temp 98.9°F | Ht 65.5 in | Wt 239.8 lb

## 2018-12-30 DIAGNOSIS — Z8619 Personal history of other infectious and parasitic diseases: Secondary | ICD-10-CM | POA: Diagnosis not present

## 2018-12-30 MED ORDER — VALACYCLOVIR HCL 500 MG PO TABS: 500.0000 mg | ORAL_TABLET | Freq: Every day | ORAL | 1 refills | Status: DC

## 2018-12-30 NOTE — Progress Notes (Signed)
Subjective:  Patient ID: Paula Holder, female    DOB: [birth year]  Age: 43 y.o. MRN: [id number]  CC: Follow-up (valtrex consult, pt stated she use to take valtrax daily--advise from [facility] (no longer with them). )  HPI Hx of Genital herpes: Diagnosed by [facility] Discontinuation of suppression therapy 25months ago. Since then she has had 2outbreaks a month. Last outbreak 2weeks ago, resolved with use of valtrex 1000mg  BID x 3days. She reports increased stress which triggers outbreaks. No ETOH use No hx of seizures or renal or liver disease.  Reviewed past Medical, Social and Family history today.  Outpatient Medications Prior to Visit  Medication Sig Dispense Refill  . clonazePAM (KLONOPIN) 0.5 MG tablet     . valACYclovir (VALTREX) 500 MG tablet Take 1 tablet (500 mg total) by mouth 2 (two) times daily. 20 tablet 0  . doxycycline (VIBRA-TABS) 100 MG tablet Take 1 tablet (100 mg total) by mouth 2 (two) times daily. (Patient not taking: Reported on 12/30/2018) 20 tablet 0  . meloxicam (MOBIC) 7.5 MG tablet Take 7.5 mg by mouth daily.  1  . QUEtiapine (SEROQUEL) 50 MG tablet TAKE 1 TABLET BY MOUTH EVERYDAY AT BEDTIME  0  . TIZANIDINE HCL PO Take by mouth.     No facility-administered medications prior to visit.     ROS See HPI  Objective:  BP 124/72   Pulse (!) 102   Temp 98.9 F (37.2 C) (Oral)   Ht 5' 5.5" (1.664 m)   Wt 239 lb 12.8 oz (108.8 kg)   SpO2 97%   BMI 39.30 kg/m   BP Readings from Last 3 Encounters:  12/30/18 124/72  07/25/18 (!) 160/96  07/18/18 140/82    Wt Readings from Last 3 Encounters:  12/30/18 239 lb 12.8 oz (108.8 kg)  07/25/18 240 lb 9.6 oz (109.1 kg)  07/18/18 240 lb 6.4 oz (109 kg)    Physical Exam Vitals signs reviewed.  Cardiovascular:     Rate and Rhythm: Normal rate.  Pulmonary:     Effort: Pulmonary effort is normal.  Neurological:     Mental Status: She is alert and oriented to person, place, and time.    Lab Results    Component Value Date   WBC 8.3 07/25/2018   HGB 12.8 07/25/2018   HCT 37.3 07/25/2018   PLT 244.0 07/25/2018   GLUCOSE 85 07/25/2018   CHOL 153 07/25/2018   TRIG 293.0 (H) 07/25/2018   HDL 31.80 (L) 07/25/2018   LDLDIRECT 97.0 07/25/2018   ALT 26 07/25/2018   AST 22 07/25/2018   NA 140 07/25/2018   K 4.0 07/25/2018   CL 105 07/25/2018   CREATININE 0.77 07/25/2018   BUN 16 07/25/2018   CO2 28 07/25/2018   TSH 3.50 07/25/2018    Mm 3d Screen Breast Bilateral  Result Date: 08/13/2018 CLINICAL DATA:  Screening. Baseline. EXAM: DIGITAL SCREENING BILATERAL MAMMOGRAM WITH TOMO AND CAD COMPARISON:  None. ACR Breast Density Category c: The breast tissue is heterogeneously dense, which may obscure small masses FINDINGS: There are no findings suspicious for malignancy. Images were processed with CAD. IMPRESSION: No mammographic evidence of malignancy. A result letter of this screening mammogram will be mailed directly to the patient. RECOMMENDATION: 1. Screening mammogram in one year. (Code:[id number]) 2. Patient describes bilateral milky yellow discharge for several years status post nipple piercing removals. Causes of nipple discharge include: Hormonal changes, fibrocystic changes, benign papilloma, abscess/mastitis, birth control pills, endocrine disorders, injury/trauma to breast,  duct ectasia, medications, prolactinoma, and breast cancer. As is evident from this list, nipple discharge often stems from a benign condition, however, breast cancer is a possibility when unilateral spontaneous persistent single duct discharge (especially bloody or clear discharge) is present. If any spontaneous bloody or clear nipple discharge is noted, especially unilateral, recommend patient be returned to the [facility] for additional diagnostic imaging. BI-RADS CATEGORY  1: Negative. Electronically Signed   By: Bary RichardStan  Maynard M.D.   On: 08/13/2018 11:54    Assessment & Plan:   Paula Holder was seen today for  follow-up.  Diagnoses and all orders for this visit:  Hx of herpes genitalis  Other orders -     valACYclovir (VALTREX) 500 MG tablet; Take 1 tablet (500 mg total) by mouth daily.   I have discontinued Paula Holder "Jace"'s valACYclovir. I am also having her start on valACYclovir. Additionally, I am having her maintain her TIZANIDINE HCL PO, clonazePAM, QUEtiapine, doxycycline, and meloxicam.  Meds ordered this encounter  Medications  . valACYclovir (VALTREX) 500 MG tablet    Sig: Take 1 tablet (500 mg total) by mouth daily.    Dispense:  90 tablet    Refill:  1    Order Specific Question:   Supervising Provider    Answer:   MATTHEWS, CODY [[id number]]    Problem List Items Addressed This Visit    None    Visit Diagnoses    Hx of herpes genitalis    -  Primary       Follow-up: No follow-ups on file.  Paula Pennaharlotte Marcia Lepera, NP

## 2018-12-30 NOTE — Patient Instructions (Addendum)
Start suppression therapy: 500mg  once a day.  Please f/up with pcp for additional refills.  Valacyclovir caplets What is this medicine? VALACYCLOVIR (val ay SYE kloe veer) is an antiviral medicine. It is used to treat or prevent infections caused by certain kinds of viruses. Examples of these infections include herpes and shingles. This medicine will not cure herpes. This medicine may be used for other purposes; ask your health care provider or pharmacist if you have questions. COMMON BRAND NAME(S): Valtrex What should I tell my health care provider before I take this medicine? They need to know if you have any of these conditions: -acquired immunodeficiency syndrome (AIDS) -any other condition that may weaken the immune system -bone marrow or kidney transplant -kidney disease -an unusual or allergic reaction to valacyclovir, acyclovir, ganciclovir, valganciclovir, other medicines, foods, dyes, or preservatives -pregnant or trying to get pregnant -breast-feeding How should I use this medicine? Take this medicine by mouth with a glass of water. Follow the directions on the prescription label. You can take this medicine with or without food. Take your doses at regular intervals. Do not take your medicine more often than directed. Finish the full course prescribed by your doctor or health care professional even if you think your condition is better. Do not stop taking except on the advice of your doctor or health care professional. Talk to your pediatrician regarding the use of this medicine in children. While this drug may be prescribed for children as young as 2 years for selected conditions, precautions do apply. Overdosage: If you think you have taken too much of this medicine contact a poison control center or emergency room at once. NOTE: This medicine is only for you. Do not share this medicine with others. What if I miss a dose? If you miss a dose, take it as soon as you can. If it is  almost time for your next dose, take only that dose. Do not take double or extra doses. What may interact with this medicine? -cimetidine -probenecid This list may not describe all possible interactions. Give your health care provider a list of all the medicines, herbs, non-prescription drugs, or dietary supplements you use. Also tell them if you smoke, drink alcohol, or use illegal drugs. Some items may interact with your medicine. What should I watch for while using this medicine? Tell your doctor or health care professional if your symptoms do not start to get better after 1 week. This medicine works best when taken early in the course of an infection, within the first 72 hours. Begin treatment as soon as possible after the first signs of infection like tingling, itching, or pain in the affected area. It is possible that genital herpes may still be spread even when you are not having symptoms. Always use safer sex practices like condoms made of latex or polyurethane whenever you have sexual contact. You should stay well hydrated while taking this medicine. Drink plenty of fluids. What side effects may I notice from receiving this medicine? Side effects that you should report to your doctor or health care professional as soon as possible: -allergic reactions like skin rash, itching or hives, swelling of the face, lips, or tongue -aggressive behavior -confusion -hallucinations -problems with balance, talking, walking -stomach pain -tremor -trouble passing urine or change in the amount of urine Side effects that usually do not require medical attention (report to your doctor or health care professional if they continue or are bothersome): -dizziness -headache -nausea, vomiting This list may  not describe all possible side effects. Call your doctor for medical advice about side effects. You may report side effects to FDA at 1-800-FDA-1088. Where should I keep my medicine? Keep out of the  reach of children. Store at room temperature between 15 and 25 degrees C (59 and 77 degrees F). Keep container tightly closed. Throw away any unused medicine after the expiration date. NOTE: This sheet is a summary. It may not cover all possible information. If you have questions about this medicine, talk to your doctor, pharmacist, or health care provider.  2019 Elsevier/Gold Standard (2012-11-05 16:34:05)

## 2019-06-21 ENCOUNTER — Other Ambulatory Visit: Payer: Self-pay | Admitting: Nurse Practitioner

## 2019-06-23 NOTE — Telephone Encounter (Signed)
Forward to pcp

## 2020-07-02 ENCOUNTER — Other Ambulatory Visit (HOSPITAL_COMMUNITY): Payer: Self-pay | Admitting: Surgery

## 2020-07-02 ENCOUNTER — Other Ambulatory Visit: Payer: Self-pay | Admitting: Surgery

## 2020-07-02 DIAGNOSIS — R1115 Cyclical vomiting syndrome unrelated to migraine: Secondary | ICD-10-CM

## 2020-07-19 ENCOUNTER — Encounter (HOSPITAL_COMMUNITY)
Admission: RE | Admit: 2020-07-19 | Discharge: 2020-07-19 | Disposition: A | Discharge status: Home / Self Care | Payer: Federal, State, Local not specified - PPO | Source: Ambulatory Visit | Attending: Surgery | Admitting: Surgery

## 2020-07-19 ENCOUNTER — Other Ambulatory Visit: Payer: Self-pay

## 2020-07-19 DIAGNOSIS — R1115 Cyclical vomiting syndrome unrelated to migraine: Secondary | ICD-10-CM | POA: Insufficient documentation

## 2020-07-19 MED ORDER — TECHNETIUM TC 99M SULFUR COLLOID
2.2000 | Freq: Once | INTRAVENOUS | Status: AC | PRN
  Administered 2020-07-19: 2.2 via ORAL

## 2020-08-23 ENCOUNTER — Ambulatory Visit: Payer: Self-pay | Admitting: Surgery

## 2020-08-23 NOTE — H&P (Signed)
CC: Cyclical nausea/vomiting  HPI: Paula Holder is a very pleasant 44yoF with history of anxiety and depression as well as chronic pain in her back was referred by her pain management specialist Dr. Olevia Perches for evaluation of upper abdominal discomfort with emesis and diarrhea. She has a three-month history of bilateral upper abdominal discomfort as well as emesis that is associated with eating meats. She denies any issues with pain vomiting or diarrhea when she eats any other food including heavy creams and other fatty foods. She denies any history of fever/chills. She had a right upper quadrant ultrasound done in 10/01/15 Dr. Wynelle Link her primary care previously-there are no gallstones or evidence of gallbladder dysfunction visualized. She did have fatty infiltration of the liver. CBD was 3 mm. No recent LFTs in our system  She underwent RUQ [place] 05/27/18 which demonstrated no gallstones or sonographic evidence of cholecystitis. There was fatty changes of the liver seen. Common bile duct was 4-6 mm in size without intraluminal stones or sludge.  INTERVAL HX She had nuclear medicine test done at [facility] - demonstrated ejection fraction of her gallbladder was 31% which was interpreted as abnormal - normal being over 33%. Her symptoms she reports as being that of cyclical nausea/vomiting. When she is retching she will have abdominal discomfort. This occurs daily. She denies any deep boring right upper quadrant pains. She denies fever/chills. She's also had a nuclear medicine gastric emptying study completed that demonstrated normal gastric emptying times. She has spent a lot of time researching her condition and remains convinced her symptoms are most attributable to her gallbladder and is highly motivated to have it removed.   PMH: Anxiety and depression-she reports poor control on tizanidine they can take anything else due to restrictions of the [employer]  PSH: She denies any prior abdominal  operations. She has had left knee arthroscopic surgery, teeth extractions including wisdom; tonsil surgery  FHx: Denies FHx of malignancy  Social: Denies use of tobacco/EtOH/drugs. She works with the [employer]  ROS: A comprehensive 10 system review of systems was completed with the patient and pertinent findings as noted above.  The patient is a 44 year old female.   Allergies Maurilio Lovely, CMA; 08/04/2020 11:02 AM) Percocet *ANALGESICS - OPIOID*  BuSpar *ANTIANXIETY AGENTS*  Propofol *GENERAL ANESTHETICS*  Allergies Reconciled   Medication History Maurilio Lovely, CMA; 08/04/2020 11:02 AM) Lisinopril (5MG  Tablet, Oral) Active. Ondansetron HCl (4MG  Tablet, Oral) Active. Mirtazapine (15MG  Tablet, Oral) Active. Gabapentin (400MG  Capsule, Oral) Active. Celecoxib (200MG  Capsule, Oral) Active. Pravastatin Sodium (20MG  Tablet, Oral) Active. Meloxicam (7.5MG  Tablet, Oral) Active. TiZANidine HCl (4MG  Tablet, Oral) Active. Multi-Vitamin (Oral) Active. Medications Reconciled    Review of Systems M. Shakeerah Gradel MD; 08/04/2020 11:41 AM) General Present- Fatigue, Weight Gain and Weight Loss. Not Present- Appetite Loss, Chills, Fever and Night Sweats. HEENT Present- Ringing in the Ears, Visual Disturbances and Wears glasses/contact lenses. Not Present- Earache, Hearing Loss, Hoarseness, Nose Bleed, Oral Ulcers, Seasonal Allergies, Sinus Pain, Sore Throat and Yellow Eyes. Respiratory Present- Snoring. Not Present- Bloody sputum, Chronic Cough, Difficulty Breathing and Wheezing. Breast Present- Breast Pain. Not Present- Breast Mass, Nipple Discharge and Skin Changes. Cardiovascular Present- Chest Pain, Leg Cramps, Palpitations and Rapid Heart Rate. Not Present- Difficulty Breathing Lying Down, Shortness of Breath and Swelling of Extremities. Gastrointestinal Present- Abdominal Pain, Bloody Stool, Change in Bowel Habits, Chronic diarrhea, Excessive gas, Nausea and Vomiting. Not  Present- Bloating, Constipation, Difficulty Swallowing, Gets full quickly at meals, Hemorrhoids, Indigestion and Rectal Pain.  Musculoskeletal Present- Back Pain, Joint Pain, Joint Stiffness, Muscle Pain and Muscle Weakness. Not Present- Swelling of Extremities. Neurological Present- Headaches, Numbness and Weakness. Not Present- Decreased Memory, Fainting, Seizures, Tingling, Tremor and Trouble walking. Psychiatric Present- Anxiety. Not Present- Bipolar, Change in Sleep Pattern, Depression, Fearful and Frequent crying. Endocrine Present- Heat Intolerance and Hot flashes. Not Present- Cold Intolerance, Excessive Hunger, Hair Changes and New Diabetes. Hematology Not Present- Abnormal Bleeding and Blood Clots.  Vitals Maurilio Lovely CMA; 08/04/2020 11:03 AM) 08/04/2020 11:02 AM Weight: 247.6 lb Height: 67in Body Surface Area: 2.21 m Body Mass Index: 38.78 kg/m  Temp.: 98.25F (Tympanic)  Pulse: 106 (Regular)  BP: 132/82(Sitting, Left Arm, Standard)       Physical Exam Cristal Deer M. Adalei Novell MD; 08/04/2020 11:41 AM) The physical exam findings are as follows: Note: Constitutional: No acute distress; conversant; no deformities; wearing mask Eyes: Moist conjunctiva; no lid lag; anicteric sclerae; pupils equal and round, wearing glasses Lungs: Normal respiratory effort CV: rrr GI: Abdomen obese soft, nontender, nondistended; no palpable hepatosplenomegaly; specifically, no significant RUQ tenderness MSK: Normal gait Psychiatric: Appropriate affect; alert and oriented 3    Assessment & Plan Cristal Deer M. Jamarion Jumonville MD; 08/04/2020 11:46 AM) BILIARY DYSKINESIA (K82.8) Story: Ms. Sporrer is a very pleasant 44yoF with now what appears to be more cyclical nausea/vomiting - sent by GI at [facility] for evaluation of gallbladder as culprit Impression: -Gastric emptying study normal -HIDA shows depressed ejection fraction on gallbladder; no clear gallstones have been seen on  ultrasounds -Fairly exhaustive workup including endoscopy. She has been on PPIs without improvement. Given all of her constellation of findings and the only abnormal study being her depressed gallbladder ejection fraction, we discussed options going forward including further observation versus surgery to remove the gallbladder. We discussed that with surgery approximately 1/3 third of biliary dyskinesia patients do have improvement and feel much better, 1/3 feel a little better, and ~ 1/3 are not improved at all and could even be worse particularly with loose stool/diarrhea. -The anatomy and physiology of the hepatobiliary system was discussed at length with the patient with associated pictures. The pathophysiology of gallbladder disease was discussed at length with associated pictures. -The planned procedure, material risks (including, but not limited to, pain, bleeding, infection, scarring, need for blood transfusion, damage to surrounding structures- blood vessels/nerves/viscus/organs, damage to bile duct, bile leak, need for additional procedures, hernia, pancreatitis, pneumonia, heart attack, stroke, death) benefits and alternatives to surgery were discussed at length. The patient's questions were answered to her satisfaction, she voiced understanding and has elected to proceed with surgery. Additionally, we discussed typical postoperative expectations and the recovery process.  This patient encounter took 24 minutes today to perform the following: take history, perform exam, review outside records, interpret imaging, counsel the patient on their diagnosis and document encounter, findings & plan in the EHR  Signed by Andria Meuse, MD (08/04/2020 11:47 AM)

## 2020-08-25 NOTE — Patient Instructions (Addendum)
DUE TO COVID-19 ONLY ONE VISITOR IS ALLOWED TO COME WITH YOU AND STAY IN THE WAITING ROOM ONLY DURING PRE OP AND PROCEDURE DAY OF SURGERY. THE 1 VISITOR  MAY VISIT WITH YOU AFTER SURGERY IN YOUR PRIVATE ROOM DURING VISITING HOURS ONLY!  . ONCE YOUR COVID TEST IS COMPLETED,  PLEASE BEGIN THE QUARANTINE INSTRUCTIONS AS OUTLINED IN YOUR HANDOUT.                Paula Holder    Your procedure is scheduled on: 08/30/20   Report to [facility] Main  Entrance   Report to admitting at  9:00 AM     Call this number if you have problems the morning of surgery [phone]    Remember: Do not eat food or drink liquids :After Midnight.   BRUSH YOUR TEETH MORNING OF SURGERY AND RINSE YOUR MOUTH OUT, NO CHEWING GUM CANDY OR MINTS.     Take these medicines the morning of surgery with A SIP OF WATER: Prilosec                                 You may not have any metal on your body including hair pins and              piercings  Do not wear jewelry, make-up, lotions, powders or perfumes, deodorant             Do not wear nail polish on your fingernails.  Do not shave  48 hours prior to surgery.              Do not bring valuables to the hospital. [facility] IS NOT             RESPONSIBLE   FOR VALUABLES.  Contacts, dentures or bridgework may not be worn into surgery.       Patients discharged the day of surgery will not be allowed to drive home  . IF YOU ARE HAVING SURGERY AND GOING HOME THE SAME DAY, YOU MUST HAVE AN ADULT TO DRIVE YOU HOME AND BE WITH YOU FOR 24 HOURS.   YOU MAY GO HOME BY TAXI OR UBER OR ORTHERWISE, BUT AN ADULT MUST ACCOMPANY YOU HOME AND STAY WITH YOU FOR 24 HOURS.  Name and phone number of your driver:  Special Instructions: N/A              Please read over the following fact sheets you were given: _____________________________________________________________________             [facility] - Preparing for Surgery  Before surgery, you can play an  important role.   Because skin is not sterile, your skin needs to be as free of germs as possible.   You can reduce the number of germs on your skin by washing with CHG (chlorahexidine gluconate) soap before surgery.   CHG is an antiseptic cleaner which kills germs and bonds with the skin to continue killing germs even after washing. Please DO NOT use if you have an allergy to CHG or antibacterial soaps.   If your skin becomes reddened/irritated stop using the CHG and inform your nurse when you arrive at Short Stay. Do not shave (including legs and underarms) for at least 48 hours prior to the first CHG shower.   Please follow these instructions carefully:   1.  Shower with CHG Soap the night before surgery and the  morning of Surgery.  2.  If you choose to wash your hair, wash your hair first as usual with your  normal  shampoo.  3.  After you shampoo, rinse your hair and body thoroughly to remove the  shampoo.                                        4.  Use CHG as you would any other liquid soap.  You can apply chg directly  to the skin and wash                       Gently with a scrungie or clean washcloth.  5.  Apply the CHG Soap to your body ONLY FROM THE NECK DOWN.   Do not use on face/ open                           Wound or open sores. Avoid contact with eyes, ears mouth and genitals (private parts).                       Wash face,  Genitals (private parts) with your normal soap.             6.  Wash thoroughly, paying special attention to the area where your surgery  will be performed.  7.  Thoroughly rinse your body with warm water from the neck down.  8.  DO NOT shower/wash with your normal soap after using and rinsing off  the CHG Soap.             9.  Pat yourself dry with a clean towel.            10.  Wear clean pajamas.            11.  Place clean sheets on your bed the night of your first shower and do not  sleep with pets. Day of Surgery : Do not apply any  lotions/deodorants the morning of surgery.  Please wear clean clothes to the hospital/surgery center.  FAILURE TO FOLLOW THESE INSTRUCTIONS MAY RESULT IN THE CANCELLATION OF YOUR SURGERY PATIENT SIGNATURE_________________________________  NURSE SIGNATURE__________________________________  ________________________________________________________________________

## 2020-08-26 ENCOUNTER — Other Ambulatory Visit (HOSPITAL_COMMUNITY)
Admission: RE | Admit: 2020-08-26 | Discharge: 2020-08-26 | Disposition: A | Discharge status: Home / Self Care | Payer: Federal, State, Local not specified - PPO | Source: Ambulatory Visit | Attending: Surgery | Admitting: Surgery

## 2020-08-26 DIAGNOSIS — Z01812 Encounter for preprocedural laboratory examination: Secondary | ICD-10-CM | POA: Insufficient documentation

## 2020-08-26 DIAGNOSIS — Z20822 Contact with and (suspected) exposure to covid-19: Secondary | ICD-10-CM | POA: Diagnosis not present

## 2020-08-26 LAB — SARS CORONAVIRUS 2 (TAT 6-24 HRS): SARS Coronavirus 2: NEGATIVE

## 2020-08-27 ENCOUNTER — Encounter (HOSPITAL_COMMUNITY)
Admission: RE | Admit: 2020-08-27 | Discharge: 2020-08-27 | Disposition: A | Discharge status: Home / Self Care | Payer: Federal, State, Local not specified - PPO | Source: Ambulatory Visit | Attending: Surgery | Admitting: Surgery

## 2020-08-27 ENCOUNTER — Encounter (HOSPITAL_COMMUNITY): Payer: Self-pay

## 2020-08-27 ENCOUNTER — Other Ambulatory Visit: Payer: Self-pay

## 2020-08-27 DIAGNOSIS — G894 Chronic pain syndrome: Secondary | ICD-10-CM | POA: Diagnosis not present

## 2020-08-27 DIAGNOSIS — Z881 Allergy status to other antibiotic agents status: Secondary | ICD-10-CM | POA: Diagnosis not present

## 2020-08-27 DIAGNOSIS — Z87891 Personal history of nicotine dependence: Secondary | ICD-10-CM | POA: Diagnosis not present

## 2020-08-27 DIAGNOSIS — F419 Anxiety disorder, unspecified: Secondary | ICD-10-CM | POA: Diagnosis not present

## 2020-08-27 DIAGNOSIS — K760 Fatty (change of) liver, not elsewhere classified: Secondary | ICD-10-CM | POA: Diagnosis not present

## 2020-08-27 DIAGNOSIS — K811 Chronic cholecystitis: Secondary | ICD-10-CM | POA: Diagnosis not present

## 2020-08-27 DIAGNOSIS — K828 Other specified diseases of gallbladder: Secondary | ICD-10-CM | POA: Diagnosis present

## 2020-08-27 DIAGNOSIS — G4730 Sleep apnea, unspecified: Secondary | ICD-10-CM | POA: Diagnosis not present

## 2020-08-27 DIAGNOSIS — Z8249 Family history of ischemic heart disease and other diseases of the circulatory system: Secondary | ICD-10-CM | POA: Diagnosis not present

## 2020-08-27 DIAGNOSIS — F329 Major depressive disorder, single episode, unspecified: Secondary | ICD-10-CM | POA: Diagnosis not present

## 2020-08-27 DIAGNOSIS — Z79899 Other long term (current) drug therapy: Secondary | ICD-10-CM | POA: Diagnosis not present

## 2020-08-27 DIAGNOSIS — Z01812 Encounter for preprocedural laboratory examination: Secondary | ICD-10-CM | POA: Insufficient documentation

## 2020-08-27 DIAGNOSIS — I10 Essential (primary) hypertension: Secondary | ICD-10-CM | POA: Diagnosis not present

## 2020-08-27 DIAGNOSIS — Z885 Allergy status to narcotic agent status: Secondary | ICD-10-CM | POA: Diagnosis not present

## 2020-08-27 DIAGNOSIS — E282 Polycystic ovarian syndrome: Secondary | ICD-10-CM | POA: Diagnosis not present

## 2020-08-27 DIAGNOSIS — M797 Fibromyalgia: Secondary | ICD-10-CM | POA: Diagnosis not present

## 2020-08-27 HISTORY — DX: Sleep apnea, unspecified: G47.30

## 2020-08-27 HISTORY — DX: Family history of other specified conditions: Z84.89

## 2020-08-27 HISTORY — DX: Essential (primary) hypertension: I10

## 2020-08-27 LAB — COMPREHENSIVE METABOLIC PANEL
ALT: 34 U/L (ref 0–44)
AST: 34 U/L (ref 15–41)
Albumin: 3.5 g/dL (ref 3.5–5.0)
Alkaline Phosphatase: 71 U/L (ref 38–126)
Anion gap: 11 (ref 5–15)
BUN: 12 mg/dL (ref 6–20)
CO2: 27 mmol/L (ref 22–32)
Calcium: 8.8 mg/dL — ABNORMAL LOW (ref 8.9–10.3)
Chloride: 101 mmol/L (ref 98–111)
Creatinine, Ser: 0.72 mg/dL (ref 0.44–1.00)
GFR calc Af Amer: >60 mL/min (ref >60)
GFR calc non Af Amer: >60 mL/min (ref >60)
Glucose, Bld: 143 mg/dL — ABNORMAL HIGH (ref 70–99)
Potassium: 4 mmol/L (ref 3.5–5.1)
Sodium: 139 mmol/L (ref 135–145)
Total Bilirubin: 0.5 mg/dL (ref 0.3–1.2)
Total Protein: 7.3 g/dL (ref 6.5–8.1)

## 2020-08-27 LAB — CBC WITH DIFFERENTIAL/PLATELET
Abs Immature Granulocytes: 0.1 10*3/uL — ABNORMAL HIGH (ref 0.00–0.07)
Basophils Absolute: 0.1 10*3/uL (ref 0.0–0.1)
Basophils Relative: 1 %
Eosinophils Absolute: 0.3 10*3/uL (ref 0.0–0.5)
Eosinophils Relative: 3 %
HCT: 40.8 % (ref 36.0–46.0)
Hemoglobin: 13.5 g/dL (ref 12.0–15.0)
Immature Granulocytes: 1 %
Lymphocytes Relative: 16 %
Lymphs Abs: 1.4 10*3/uL (ref 0.7–4.0)
MCH: 30.5 pg (ref 26.0–34.0)
MCHC: 33.1 g/dL (ref 30.0–36.0)
MCV: 92.3 fL (ref 80.0–100.0)
Monocytes Absolute: 0.5 10*3/uL (ref 0.1–1.0)
Monocytes Relative: 6 %
Neutro Abs: 6.7 10*3/uL (ref 1.7–7.7)
Neutrophils Relative %: 73 %
Platelets: 218 10*3/uL (ref 150–400)
RBC: 4.42 MIL/uL (ref 3.87–5.11)
RDW: 12.5 % (ref 11.5–15.5)
WBC: 9 10*3/uL (ref 4.0–10.5)
nRBC: 0 % (ref 0.0–0.2)

## 2020-08-27 LAB — PROTIME-INR
INR: 1 (ref 0.8–1.2)
Prothrombin Time: 12.5 seconds (ref 11.4–15.2)

## 2020-08-27 LAB — APTT: aPTT: 32 seconds (ref 24–36)

## 2020-08-27 NOTE — Progress Notes (Signed)
COVID Vaccine Completed:No Date COVID Vaccine completed: COVID vaccine manufacturer: Pfizer    Moderna   [employer]   PCP - urgent care [facility] - no  Chest x-ray - no EKG - 08/27/20-chart Stress Test - no ECHO - no Cardiac Cath - no Pacemaker/ICD device last checked:NA  Sleep Study - yes CPAP - yes  Fasting Blood Sugar - NA Checks Blood Sugar _____ times a day  Blood Thinner Instructions:NA Aspirin Instructions: Last Dose:  Anesthesia review:   Patient denies shortness of breath, fever, cough and chest pain at PAT appointment yes  Patient verbalized understanding of instructions that were given to them at the PAT appointment. Patient was also instructed that they will need to review over the PAT instructions again at home before surgery. Yes  Pt says that she is out of shape and can climb 2 flights of stairs but no SOB doing house work of ADLs.  She uses a C-Pap at night. She is allergic to propofol and has a family history of people waking up during surgery.

## 2020-08-30 ENCOUNTER — Ambulatory Visit (HOSPITAL_COMMUNITY): Payer: Federal, State, Local not specified - PPO | Admitting: Certified Registered Nurse Anesthetist

## 2020-08-30 ENCOUNTER — Encounter (HOSPITAL_COMMUNITY): Payer: Self-pay | Admitting: Surgery

## 2020-08-30 ENCOUNTER — Encounter (HOSPITAL_COMMUNITY)
Admission: RE | Disposition: A | Discharge status: Home / Self Care | Payer: Self-pay | Source: Home / Self Care | Attending: Surgery

## 2020-08-30 ENCOUNTER — Ambulatory Visit (HOSPITAL_COMMUNITY)
Admission: RE | Admit: 2020-08-30 | Discharge: 2020-08-30 | Disposition: A | Discharge status: Home / Self Care | Payer: Federal, State, Local not specified - PPO | Attending: Surgery | Admitting: Surgery

## 2020-08-30 DIAGNOSIS — G894 Chronic pain syndrome: Secondary | ICD-10-CM | POA: Insufficient documentation

## 2020-08-30 DIAGNOSIS — F329 Major depressive disorder, single episode, unspecified: Secondary | ICD-10-CM | POA: Insufficient documentation

## 2020-08-30 DIAGNOSIS — G4730 Sleep apnea, unspecified: Secondary | ICD-10-CM | POA: Insufficient documentation

## 2020-08-30 DIAGNOSIS — M797 Fibromyalgia: Secondary | ICD-10-CM | POA: Insufficient documentation

## 2020-08-30 DIAGNOSIS — K811 Chronic cholecystitis: Secondary | ICD-10-CM | POA: Insufficient documentation

## 2020-08-30 DIAGNOSIS — Z885 Allergy status to narcotic agent status: Secondary | ICD-10-CM | POA: Insufficient documentation

## 2020-08-30 DIAGNOSIS — K760 Fatty (change of) liver, not elsewhere classified: Secondary | ICD-10-CM | POA: Insufficient documentation

## 2020-08-30 DIAGNOSIS — F419 Anxiety disorder, unspecified: Secondary | ICD-10-CM | POA: Insufficient documentation

## 2020-08-30 DIAGNOSIS — Z79899 Other long term (current) drug therapy: Secondary | ICD-10-CM | POA: Insufficient documentation

## 2020-08-30 DIAGNOSIS — Z8249 Family history of ischemic heart disease and other diseases of the circulatory system: Secondary | ICD-10-CM | POA: Insufficient documentation

## 2020-08-30 DIAGNOSIS — Z87891 Personal history of nicotine dependence: Secondary | ICD-10-CM | POA: Insufficient documentation

## 2020-08-30 DIAGNOSIS — Z881 Allergy status to other antibiotic agents status: Secondary | ICD-10-CM | POA: Insufficient documentation

## 2020-08-30 DIAGNOSIS — I10 Essential (primary) hypertension: Secondary | ICD-10-CM | POA: Insufficient documentation

## 2020-08-30 DIAGNOSIS — E282 Polycystic ovarian syndrome: Secondary | ICD-10-CM | POA: Insufficient documentation

## 2020-08-30 HISTORY — PX: CHOLECYSTECTOMY: SHX55

## 2020-08-30 LAB — PREGNANCY, URINE: Preg Test, Ur: NEGATIVE

## 2020-08-30 SURGERY — LAPAROSCOPIC CHOLECYSTECTOMY
Anesthesia: General

## 2020-08-30 MED ORDER — FENTANYL CITRATE (PF) 100 MCG/2ML IJ SOLN
INTRAMUSCULAR | Status: AC
  Filled 2020-08-30: qty 2

## 2020-08-30 MED ORDER — LACTATED RINGERS IR SOLN
Status: DC | PRN
  Administered 2020-08-30: 1000 mL

## 2020-08-30 MED ORDER — HYDROMORPHONE HCL 2 MG/ML IJ SOLN
INTRAMUSCULAR | Status: AC
  Filled 2020-08-30: qty 1

## 2020-08-30 MED ORDER — ETOMIDATE 2 MG/ML IV SOLN
INTRAVENOUS | Status: AC
  Filled 2020-08-30: qty 20

## 2020-08-30 MED ORDER — ROCURONIUM BROMIDE 10 MG/ML (PF) SYRINGE
PREFILLED_SYRINGE | INTRAVENOUS | Status: DC | PRN
  Administered 2020-08-30: 60 mg via INTRAVENOUS
  Administered 2020-08-30: 30 mg via INTRAVENOUS

## 2020-08-30 MED ORDER — PROMETHAZINE HCL 25 MG/ML IJ SOLN: 6.2500 mg | INTRAMUSCULAR | Status: DC | PRN

## 2020-08-30 MED ORDER — LIDOCAINE 2% (20 MG/ML) 5 ML SYRINGE
INTRAMUSCULAR | Status: DC | PRN
  Administered 2020-08-30: 100 mg via INTRAVENOUS

## 2020-08-30 MED ORDER — FENTANYL CITRATE (PF) 250 MCG/5ML IJ SOLN
INTRAMUSCULAR | Status: DC | PRN
Start: 2020-08-30 — End: 2020-08-30
  Administered 2020-08-30 (×5): 50 ug via INTRAVENOUS

## 2020-08-30 MED ORDER — DEXAMETHASONE SODIUM PHOSPHATE 10 MG/ML IJ SOLN
INTRAMUSCULAR | Status: AC
  Filled 2020-08-30: qty 1

## 2020-08-30 MED ORDER — ACETAMINOPHEN 500 MG PO TABS
1000.0000 mg | ORAL_TABLET | ORAL | Status: AC
  Administered 2020-08-30: 1000 mg via ORAL
  Filled 2020-08-30: qty 2

## 2020-08-30 MED ORDER — ONDANSETRON HCL 4 MG/2ML IJ SOLN
INTRAMUSCULAR | Status: DC | PRN
  Administered 2020-08-30: 4 mg via INTRAVENOUS

## 2020-08-30 MED ORDER — SUCCINYLCHOLINE CHLORIDE 200 MG/10ML IV SOSY
PREFILLED_SYRINGE | INTRAVENOUS | Status: DC | PRN
  Administered 2020-08-30: 160 mg via INTRAVENOUS

## 2020-08-30 MED ORDER — SUGAMMADEX SODIUM 200 MG/2ML IV SOLN
INTRAVENOUS | Status: DC | PRN
  Administered 2020-08-30: 300 mg via INTRAVENOUS

## 2020-08-30 MED ORDER — CHLORHEXIDINE GLUCONATE CLOTH 2 % EX PADS: 6.0000 | MEDICATED_PAD | Freq: Once | CUTANEOUS | Status: DC

## 2020-08-30 MED ORDER — LACTATED RINGERS IV SOLN: INTRAVENOUS | Status: DC | PRN

## 2020-08-30 MED ORDER — DEXAMETHASONE SODIUM PHOSPHATE 10 MG/ML IJ SOLN
INTRAMUSCULAR | Status: DC | PRN
  Administered 2020-08-30: 8 mg via INTRAVENOUS

## 2020-08-30 MED ORDER — CEFAZOLIN SODIUM-DEXTROSE 2-4 GM/100ML-% IV SOLN
2.0000 g | INTRAVENOUS | Status: AC
  Administered 2020-08-30: 2 g via INTRAVENOUS
  Filled 2020-08-30: qty 100

## 2020-08-30 MED ORDER — LIDOCAINE 2% (20 MG/ML) 5 ML SYRINGE
INTRAMUSCULAR | Status: AC
  Filled 2020-08-30: qty 5

## 2020-08-30 MED ORDER — LACTATED RINGERS IV SOLN: INTRAVENOUS | Status: DC

## 2020-08-30 MED ORDER — 0.9 % SODIUM CHLORIDE (POUR BTL) OPTIME
TOPICAL | Status: DC | PRN
  Administered 2020-08-30: 1000 mL

## 2020-08-30 MED ORDER — ROCURONIUM BROMIDE 10 MG/ML (PF) SYRINGE
PREFILLED_SYRINGE | INTRAVENOUS | Status: AC
  Filled 2020-08-30: qty 10

## 2020-08-30 MED ORDER — ETOMIDATE 2 MG/ML IV SOLN
INTRAVENOUS | Status: DC | PRN
  Administered 2020-08-30: 28 mg via INTRAVENOUS

## 2020-08-30 MED ORDER — MEPERIDINE HCL 50 MG/ML IJ SOLN: 6.2500 mg | INTRAMUSCULAR | Status: DC | PRN

## 2020-08-30 MED ORDER — SUCCINYLCHOLINE CHLORIDE 200 MG/10ML IV SOSY
PREFILLED_SYRINGE | INTRAVENOUS | Status: AC
  Filled 2020-08-30: qty 10

## 2020-08-30 MED ORDER — ESMOLOL HCL 100 MG/10ML IV SOLN
INTRAVENOUS | Status: AC
  Filled 2020-08-30: qty 10

## 2020-08-30 MED ORDER — CHLORHEXIDINE GLUCONATE 0.12 % MT SOLN
15.0000 mL | Freq: Once | OROMUCOSAL | Status: AC
  Administered 2020-08-30: 15 mL via OROMUCOSAL

## 2020-08-30 MED ORDER — HYDROMORPHONE HCL 1 MG/ML IJ SOLN
INTRAMUSCULAR | Status: DC | PRN
  Administered 2020-08-30: .5 mg via INTRAVENOUS

## 2020-08-30 MED ORDER — ORAL CARE MOUTH RINSE: 15.0000 mL | Freq: Once | OROMUCOSAL | Status: AC

## 2020-08-30 MED ORDER — DEXMEDETOMIDINE (PRECEDEX) IN NS 20 MCG/5ML (4 MCG/ML) IV SYRINGE
PREFILLED_SYRINGE | INTRAVENOUS | Status: AC
  Filled 2020-08-30: qty 5

## 2020-08-30 MED ORDER — BUPIVACAINE-EPINEPHRINE 0.25% -1:200000 IJ SOLN
INTRAMUSCULAR | Status: AC
  Filled 2020-08-30: qty 1

## 2020-08-30 MED ORDER — BUPIVACAINE-EPINEPHRINE 0.25% -1:200000 IJ SOLN
INTRAMUSCULAR | Status: DC | PRN
  Administered 2020-08-30: 25 mL

## 2020-08-30 MED ORDER — DEXMEDETOMIDINE (PRECEDEX) IN NS 20 MCG/5ML (4 MCG/ML) IV SYRINGE
PREFILLED_SYRINGE | INTRAVENOUS | Status: DC | PRN
  Administered 2020-08-30: 8 ug via INTRAVENOUS
  Administered 2020-08-30: 4 ug via INTRAVENOUS
  Administered 2020-08-30: 8 ug via INTRAVENOUS

## 2020-08-30 MED ORDER — TRAMADOL HCL 50 MG PO TABS: 50.0000 mg | ORAL_TABLET | Freq: Four times a day (QID) | ORAL | 0 refills | Status: AC | PRN

## 2020-08-30 MED ORDER — ONDANSETRON HCL 4 MG/2ML IJ SOLN
INTRAMUSCULAR | Status: AC
  Filled 2020-08-30: qty 2

## 2020-08-30 MED ORDER — MIDAZOLAM HCL 5 MG/5ML IJ SOLN
INTRAMUSCULAR | Status: DC | PRN
  Administered 2020-08-30: 2 mg via INTRAVENOUS

## 2020-08-30 MED ORDER — MIDAZOLAM HCL 2 MG/2ML IJ SOLN
INTRAMUSCULAR | Status: AC
  Filled 2020-08-30: qty 2

## 2020-08-30 MED ORDER — FENTANYL CITRATE (PF) 250 MCG/5ML IJ SOLN
INTRAMUSCULAR | Status: AC
  Filled 2020-08-30: qty 5

## 2020-08-30 MED ORDER — FENTANYL CITRATE (PF) 100 MCG/2ML IJ SOLN
25.0000 ug | INTRAMUSCULAR | Status: DC | PRN
  Administered 2020-08-30 (×2): 50 ug via INTRAVENOUS

## 2020-08-30 MED ORDER — PHENYLEPHRINE 40 MCG/ML (10ML) SYRINGE FOR IV PUSH (FOR BLOOD PRESSURE SUPPORT)
PREFILLED_SYRINGE | INTRAVENOUS | Status: AC
  Filled 2020-08-30: qty 10

## 2020-08-30 MED ORDER — ESMOLOL HCL 100 MG/10ML IV SOLN
INTRAVENOUS | Status: DC | PRN
  Administered 2020-08-30: 20 mg via INTRAVENOUS
  Administered 2020-08-30 (×2): 30 mg via INTRAVENOUS

## 2020-08-30 SURGICAL SUPPLY — 47 items
ADH SKN CLS APL DERMABOND .7 (GAUZE/BANDAGES/DRESSINGS) ×1
APL PRP STRL LF DISP 70% ISPRP (MISCELLANEOUS) ×1
APL SRG 38 LTWT LNG FL B (MISCELLANEOUS)
APPLICATOR ARISTA FLEXITIP XL (MISCELLANEOUS) IMPLANT
APPLIER CLIP 5 13 M/L LIGAMAX5 (MISCELLANEOUS) ×2
APPLIER CLIP ROT 10 11.4 M/L (STAPLE)
APR CLP MED LRG 11.4X10 (STAPLE)
APR CLP MED LRG 5 ANG JAW (MISCELLANEOUS) ×1
BAG SPEC RTRVL LRG 6X4 10 (ENDOMECHANICALS) ×1
CABLE HIGH FREQUENCY MONO STRZ (ELECTRODE) ×2 IMPLANT
CHLORAPREP W/TINT 26 (MISCELLANEOUS) ×2 IMPLANT
CLIP APPLIE 5 13 M/L LIGAMAX5 (MISCELLANEOUS) ×1 IMPLANT
CLIP APPLIE ROT 10 11.4 M/L (STAPLE) IMPLANT
COVER MAYO STAND STRL (DRAPES) IMPLANT
COVER SURGICAL LIGHT HANDLE (MISCELLANEOUS) ×2 IMPLANT
COVER WAND RF STERILE (DRAPES) IMPLANT
DECANTER SPIKE VIAL GLASS SM (MISCELLANEOUS) ×2 IMPLANT
DERMABOND ADVANCED (GAUZE/BANDAGES/DRESSINGS) ×1
DERMABOND ADVANCED .7 DNX12 (GAUZE/BANDAGES/DRESSINGS) ×1 IMPLANT
DISSECTOR BLUNT TIP ENDO 5MM (MISCELLANEOUS) IMPLANT
DRAPE C-ARM 42X120 X-RAY (DRAPES) IMPLANT
ELECT PENCIL ROCKER SW 15FT (MISCELLANEOUS) ×2 IMPLANT
ELECT REM PT RETURN 15FT ADLT (MISCELLANEOUS) ×2 IMPLANT
GLOVE BIO SURGEON STRL SZ7.5 (GLOVE) ×2 IMPLANT
GLOVE INDICATOR 8.0 STRL GRN (GLOVE) ×2 IMPLANT
GOWN STRL REUS W/TWL XL LVL3 (GOWN DISPOSABLE) ×4 IMPLANT
GRASPER SUT TROCAR 14GX15 (MISCELLANEOUS) IMPLANT
HEMOSTAT ARISTA ABSORB 3G PWDR (HEMOSTASIS) IMPLANT
HEMOSTAT SNOW SURGICEL 2X4 (HEMOSTASIS) IMPLANT
KIT BASIN OR (CUSTOM PROCEDURE TRAY) ×2 IMPLANT
NEEDLE INSUFFLATION 14GA 120MM (NEEDLE) ×2 IMPLANT
POUCH SPECIMEN RETRIEVAL 10MM (ENDOMECHANICALS) ×2 IMPLANT
SCISSORS LAP 5X35 DISP (ENDOMECHANICALS) ×2 IMPLANT
SET CHOLANGIOGRAPH MIX (MISCELLANEOUS) IMPLANT
SET IRRIG TUBING LAPAROSCOPIC (IRRIGATION / IRRIGATOR) ×2 IMPLANT
SET TUBE SMOKE EVAC HIGH FLOW (TUBING) ×2 IMPLANT
SLEEVE ADV FIXATION 5X100MM (TROCAR) ×4 IMPLANT
SUT MNCRL AB 4-0 PS2 18 (SUTURE) ×4 IMPLANT
SYR 10ML ECCENTRIC (SYRINGE) ×2 IMPLANT
TOWEL OR 17X26 10 PK STRL BLUE (TOWEL DISPOSABLE) ×2 IMPLANT
TOWEL OR NON WOVEN STRL DISP B (DISPOSABLE) IMPLANT
TRAY LAPAROSCOPIC (CUSTOM PROCEDURE TRAY) ×2 IMPLANT
TROCAR ADV FIXATION 12X100MM (TROCAR) IMPLANT
TROCAR ADV FIXATION 5X100MM (TROCAR) ×2 IMPLANT
TROCAR BLADELESS OPT 5 100 (ENDOMECHANICALS) ×2 IMPLANT
TROCAR XCEL BLUNT TIP 100MML (ENDOMECHANICALS) ×2 IMPLANT
TROCAR XCEL NON-BLD 11X100MML (ENDOMECHANICALS) IMPLANT

## 2020-08-30 NOTE — Discharge Instructions (Signed)
POST OP INSTRUCTIONS ° °1. DIET: As tolerated. Follow a light bland diet the first 24 hours after arrival home, such as soup, liquids, crackers, etc.  Be sure to include lots of fluids daily.  Avoid fast food or heavy meals as your are more likely to get nauseated.  Eat a low fat the next few days after surgery. ° °2. Take your usually prescribed home medications unless otherwise directed. ° °3. PAIN CONTROL: °a. Pain is best controlled by a usual combination of three different methods TOGETHER: °i. Ice/Heat °ii. Over the counter pain medication °iii. Prescription pain medication °b. Most patients will experience some swelling and bruising around the surgical site.  Ice packs or heating pads (30-60 minutes up to 6 times a day) will help. Some people prefer to use ice alone, heat alone, alternating between ice & heat.  Experiment to what works for you.  Swelling and bruising can take several weeks to resolve.   °c. It is helpful to take an over-the-counter pain medication regularly for the first few weeks: °i. Ibuprofen (Motrin/Advil) - 200mg tabs - take 3 tabs (600mg) every 6 hours as needed for pain °ii. Acetaminophen (Tylenol) - you may take 650mg every 6 hours as needed. You can take this with motrin as they act differently on the body. If you are taking a narcotic pain medication that has acetaminophen in it, do not take over the counter tylenol at the same time. ° Iii. NOTE: You may take both of these medications together - most patients  find it most helpful when alternating between the two (i.e. Ibuprofen at 6am, tylenol at 9am, ibuprofen at 12pm ...) °d. A  prescription for pain medication should be given to you upon discharge.  Take your pain medication as prescribed if your pain is not adequatly controlled with the over-the-counter pain reliefs mentioned above. ° °4. Avoid getting constipated.  Between the surgery and the pain medications, it is common to experience some constipation.  Increasing fluid  intake and taking a fiber supplement (such as Metamucil, Citrucel, FiberCon, MiraLax, etc) 1-2 times a day regularly will usually help prevent this problem from occurring.  A mild laxative (prune juice, Milk of Magnesia, MiraLax, etc) should be taken according to package directions if there are no bowel movements after 48 hours.   ° °5. Dressing: Your incision is covered in Dermabond which is like sterile superglue for the skin. This will come off on it's own in a couple weeks. It is waterproof and you may bathe normally starting the day after your surgery in a shower. Avoid baths/pools/lakes/oceans until your wounds have fully healed. ° °6. ACTIVITIES as tolerated:   °a. Avoid heavy lifting (>10lbs or 1 gallon of milk) for the next 6 weeks. °b. You may resume regular (light) daily activities beginning the next day--such as daily self-care, walking, climbing stairs--gradually increasing activities as tolerated.  If you can walk 30 minutes without difficulty, it is safe to try more intense activity such as jogging, treadmill, bicycling, low-impact aerobics.  °c. DO NOT PUSH THROUGH PAIN.  Let pain be your guide: If it hurts to do something, don't do it. °d. You may drive when you are no longer taking prescription pain medication, you can comfortably wear a seatbelt, and you can safely maneuver your car and apply brakes. ° ° °7. FOLLOW UP in our office °a. Please call [facility] at [phone] to set up an appointment to see your surgeon in the office for a follow-up appointment   approximately 2 weeks after your surgery. °b. Make sure that you call for this appointment the day you arrive home to insure a convenient appointment time. ° °9. If you have disability or family leave forms that need to be completed, you may have them completed by your primary care physician's office; for return to work instructions, please ask our office staff and they will be happy to assist you in obtaining this documentation °  °When to call  us [phone]: °1. Poor pain control °2. Reactions / problems with new medications (rash/itching, etc)  °3. Fever over 101.5 F (38.5 C) °4. Inability to urinate °5. Nausea/vomiting °6. Worsening swelling or bruising °7. Continued bleeding from incision. °8. Increased pain, redness, or drainage from the incision ° °The clinic staff is available to answer your questions during regular business hours (8:30am-5pm).  Please don’t hesitate to call and ask to speak to one of our nurses for clinical concerns.   A surgeon from [facility] is always on call at the hospitals °  °If you have a medical emergency, go to the nearest emergency room or call 911. ° °[facility] °[address], [address], [place], [place]  [address] °MAIN: [phone] °FAX: [phone] °[email or web address] °

## 2020-08-30 NOTE — Transfer of Care (Signed)
Immediate Anesthesia Transfer of Care Note  Patient: Paula Holder  Procedure(s) Performed: LAPAROSCOPIC CHOLECYSTECTOMY (N/A )  Patient Location: PACU  Anesthesia Type:General  Level of Consciousness: awake, alert , oriented and patient cooperative  Airway & Oxygen Therapy: Patient Spontanous Breathing and Patient connected to face mask oxygen  Post-op Assessment: Report given to RN, Post -op Vital signs reviewed and stable and Patient moving all extremities  Post vital signs: Reviewed and stable  Last Vitals:  Vitals Value Taken Time  BP 165/98 08/30/20 1200  Temp 37 C 08/30/20 1156  Pulse 89 08/30/20 1206  Resp 16 08/30/20 1156  SpO2 99 % 08/30/20 1206  Vitals shown include unvalidated device data.  Last Pain:  Vitals:   08/30/20 1200  TempSrc:   PainSc: Asleep         Complications: No complications documented.

## 2020-08-30 NOTE — Anesthesia Procedure Notes (Signed)
Procedure Name: Intubation Date/Time: 08/30/2020 10:24 AM Performed by: Mitzie Na, CRNA Pre-anesthesia Checklist: Patient identified, Emergency Drugs available, Suction available and Patient being monitored Patient Re-evaluated:Patient Re-evaluated prior to induction Oxygen Delivery Method: Circle system utilized Preoxygenation: Pre-oxygenation with 100% oxygen Induction Type: IV induction and Rapid sequence Laryngoscope Size: Mac and 3 Grade View: Grade II Tube type: Oral Tube size: 7.0 mm Number of attempts: 1 Airway Equipment and Method: Stylet and Oral airway Placement Confirmation: ETT inserted through vocal cords under direct vision,  positive ETCO2 and breath sounds checked- equal and bilateral Secured at: 22 cm Tube secured with: Tape Dental Injury: Teeth and Oropharynx as per pre-operative assessment

## 2020-08-30 NOTE — H&P (Signed)
H&P from today was entered as a 'progress note'  Marin Olp, M.D. [facility] Use [email or web address] to contact on call provider

## 2020-08-30 NOTE — Op Note (Signed)
08/30/2020 11:45 AM  PATIENT: Paula Holder  44 y.o. female  Patient Care Team: Patient, No Pcp Per as PCP - General (General Practice)  PRE-OPERATIVE DIAGNOSIS: Biliary dyskinesia  POST-OPERATIVE DIAGNOSIS: Same  PROCEDURE: Laparoscopic cholecystectomy  SURGEON: Stephanie Coup. Nawaf Strange, MD  ASSISTANT: Saddie Benders, PA-C  ANESTHESIA: General endotracheal  EBL: Total I/O In: 1000 [I.V.:1000] Out: 15 [Blood:15]  DRAINS: None  SPECIMEN: Gallbladder  COUNTS: Sponge, needle and instrument counts were reported correct x2 at the conclusion of the operation  DISPOSITION: PACU in satisfactory condition  COMPLICATIONS: None  FINDINGS: Dilated gallbladder with fibrous adhesions to infundibulum from omentum and duodenum, ?prior bout of cholecystitis. Gallbladder aspirated to facilitate retraction. Critical view of safety obtained prior to clipping or dividing any structures.  INDICATION: Paula Holder is a very pleasant 44yoF with history of upper abdominal discomfort, emesis, diarrhea.  We have been following her for the past couple of years in the office and she has had numerous issues with regards to her of her abdominal pain as well as cyclical nausea and vomiting.  She has had multiple studies performed to further evaluate this.  She has not had definitive evidence on imaging to suggest gallstones or significant amounts of gallbladder sludge.  She has had multiple right upper quadrant ultrasounds.  She has had some fatty changes within the liver.  Ultimately, at [facility], a HIDA scan was ordered and demonstrated her gallbladder ejection fraction to be 31% and was interpreted as abnormal.  She was referred to our office.  We spent a long time going over everything with her including expectations particularly given the potential diagnosis of biliary dyskinesia.  With her history of diarrhea we also were cautious to warn her that this may be exacerbated by gallbladder removal.  She has  spent a lot of time researching her symptoms as well and was quite convinced that this was from her gallbladder.  We discussed potential for persistent symptoms following surgery as her symptoms have been somewhat atypical.  Please refer to notes elsewhere for details regarding all of these discussions.  DESCRIPTION:  The patient was identified & brought into the operating room. She was then positioned supine on the OR table. SCDs were in place and active during the entire case. She then underwent general endotracheal anesthesia. Pressure points were padded. Hair on the abdomen was clipped by the OR team. The abdomen was prepped and draped in the standard sterile fashion. Antibiotics were administered. A surgical timeout was performed and confirmed our plan.   Given habitus, decision was made to proceed with a Veress needle entry.  An OG tube was placed by anesthesia and confirmed to be to suction.  At Palmer's point, a stab incision was created and the Veress needle introduced into the peritoneal cavity and the first attempt.  Intraperitoneal location was confirmed with the aspiration and saline drop test.  Pneumoperitoneum was established with CO2 to max pressure 15 mmHg.  At Palmer's point, a 5 mm optical viewing trocar was carefully placed.  The laparoscope was inserted and demonstrated no evidence of Veress needle site or trocar site complications.  The abdomen was surveyed.  Liver edges were rounded.  There is no other significant pathology identified.  The gallbladder is not initially visible.  A 5 mm trocar was placed well cephalad to the umbilicus given habitus, however, ultimately, an additional 5 mm trocar was placed infraumbilically as the gallbladder was large and her liver was well below the costal margin.  3 additional trochars were placed in the right upper quadrant.  An 11 mm trocar was placed obliquely entering just to the right of the falciform.  Two 5 mm trochars were placed in a line  extending laterally from this 11 mm trocar.  She was positioned in Trendelenburg with some right side up.  The liver and gallbladder were inspected.  The gallbladder was quite dilated and full.  It was too tense to be grasped with our laparoscopic graspers.  This was therefore aspirated using the Nezhat. The gallbladder fundus was grasped and elevated cephalad. An additional grasper was then placed on the infundibulum of the gallbladder and the infundibulum was retracted laterally. Staying high on the gallbladder, the peritoneum on both sides of the gallbladder was opened with hook cautery. Gentle blunt dissection was then employed with a Art gallery manager working down into [employer]. The cystic duct was identified and carefully circumferentially dissected. The cystic artery was also identified and carefully circumferentially dissected. The space between the cystic artery and hepatocystic plate was developed such that a good view of the liver could be seen through a window medial to the cystic artery. The triangle of Calot had been cleared of all fibrofatty tissue. At this point, a critical view of safety was achieved and the only structures visualized was the skeletonized cystic duct laterally, the skeletonized cystic artery and the liver through the window medial to the artery. No posterior cystic artery was noted  A cholangiogram was not performed at this point as her cystic duct was quite diminutive in size and there was concern for potential avulsion or backwall injury with attempts at passing cholangiocatheter.  The cystic duct and artery were clipped with 2 clips on the patient side and 1 clip on the specimen side. The cystic duct and artery were then divided. The gallbladder was then freed from its remaining attachments to the liver using electrocautery and placed into an endocatch bag. The RUQ was gently irrigated with sterile saline. Hemostasis was then verified. The clips were in good  position; the gallbladder fossa was dry.  There was a small amount of bile spillage as a result of [place] having aspirated the gallbladder to facilitate retraction but this was all cleared with suction/irrigation.  The endocatch bag containing the gallbladder was then removed from the 11 mm port site and passed off as specimen. The RUQ ports were removed under direct visualization and noted to be hemostatic.  Using a laparoscopic suture passer, a 0 Vicryl suture was then used to close the 11 mm fascial defect.  This was then palpated and noted to be completely closed.  The fascia was palpated and noted to be completely closed. The skin of all incision sites was approximated with 4-0 monocryl subcuticular suture and dermabond applied. She was then awakened from anesthesia, extubated, and transferred to a stretcher for transport to PACU in satisfactory condition.

## 2020-08-30 NOTE — H&P (Signed)
CC: Here today for surgery  HPI: Paula Holder is a very pleasant 44yoF with history of anxiety and depression as well as chronic pain in her back was referred by her pain management specialist Dr. Olevia Perches for evaluation of upper abdominal discomfort with emesis and diarrhea. She has a three-month history of bilateral upper abdominal discomfort as well as emesis that is associated with eating meats. She denies any issues with pain vomiting or diarrhea when she eats any other food including heavy creams and other fatty foods. She denies any history of fever/chills. She had a right upper quadrant ultrasound done in 10/01/15 with her primary care previously-there were no gallstones or evidence of gallbladder dysfunction visualized. She did have fatty infiltration of the liver. CBD was 3 mm. No recent LFTs in our system  She underwent RUQ [place] 05/27/18 which demonstrated no gallstones or sonographic evidence of cholecystitis. There was fatty changes of the liver seen. Common bile duct was 4-6 mm in size without intraluminal stones or sludge.  She had nuclear medicine test done at [facility] - demonstrated ejection fraction of her gallbladder was 31% which was interpreted as abnormal - normal being over 33%. Her symptoms she reports as being that of cyclical nausea/vomiting. When she is retching she will have abdominal discomfort. This occurs daily. She denies any deep boring right upper quadrant pains. She denies fever/chills. She's also had a nuclear medicine gastric emptying study completed that demonstrated normal gastric emptying times. She has spent a lot of time researching her condition and remains convinced her symptoms are most attributable to her gallbladder and is highly motivated to have it removed.  She denies any changes in her health or health history   PMH: Anxiety and depression-she reports poor control on tizanidine they can take anything else due to restrictions  of the [employer]  PSH: She denies any prior abdominal operations. She has had left knee arthroscopic surgery, teeth extractions including wisdom; tonsil surgery  FHx: Denies FHx of malignancy  Social: Denies use of tobacco/EtOH/drugs. She works with the [employer]  ROS: A comprehensive 10 system review of systems was completed with the patient and pertinent findings as noted above.  Past Medical History:  Diagnosis Date  . Anxiety   . Chronic pain syndrome   . Family history of adverse reaction to anesthesia    "wake up during surgery"  . Fibromyalgia   . Hypertension   . PCOS (polycystic ovarian syndrome)   . Sleep apnea     Past Surgical History:  Procedure Laterality Date  . COLONOSCOPY    . DILATION AND CURETTAGE OF UTERUS    . KNEE SURGERY    . MOUTH SURGERY      Family History  Problem Relation Age of Onset  . Hypertension Mother   . Cancer Maternal Grandmother        skin  . Diabetes Paternal Grandmother   . Breast cancer Maternal Aunt     Social:  reports that she quit smoking about 5 years ago. Her smoking use included cigarettes. She has a 15.00 pack-year smoking history. She has never used smokeless tobacco. She reports that she does not drink alcohol and does not use drugs.  Allergies:  Allergies  Allergen Reactions  . Bactrim [Sulfamethoxazole-Trimethoprim]     Caused worsening of skin infection  . Buspar [Buspirone] Hives  . Percocet [Oxycodone-Acetaminophen] Hives  . Propofol Hives    Medications: I have reviewed the patient's current medications.  No results found for  this or any previous visit (from the past 48 hour(s)).  No results found.  ROS - all of the below systems have been reviewed with the patient and positives are indicated with bold text General: chills, fever or night sweats Eyes: blurry vision or double vision ENT: epistaxis or sore throat Allergy/Immunology: itchy/watery eyes or nasal congestion Hematologic/Lymphatic: bleeding  problems, blood clots or swollen lymph nodes Endocrine: temperature intolerance or unexpected weight changes Breast: new or changing breast lumps or nipple discharge Resp: cough, shortness of breath, or wheezing CV: chest pain or dyspnea on exertion GI: as per HPI GU: dysuria, trouble voiding, or hematuria MSK: joint pain or joint stiffness Neuro: TIA or stroke symptoms Derm: pruritus and skin lesion changes Psych: anxiety and depression  PE Blood pressure (!) 141/86, pulse (!) 104, temperature 98 F (36.7 C), temperature source Oral, resp. rate 17, SpO2 100 %. Constitutional: NAD; conversant Eyes: Moist conjunctiva; no lid lag; anicteric; PERRL Lungs: Normal respiratory effort CV: RRR; no palpable thrills; no pitting edema GI: Abd soft, nondistended, nontender; no palpable hepatosplenomegaly Psychiatric: Appropriate affect; alert and oriented x3  No results found for this or any previous visit (from the past 48 hour(s)).  No results found.   A/P: Paula Holder is a very pleasant 44yoF with now what appears to be more cyclical nausea/vomiting - sent by GI at [facility] for evaluation of gallbladder as culprit  -Gastric emptying study normal -HIDA shows depressed ejection fraction on gallbladder; no clear gallstones have been seen on ultrasounds -Fairly exhaustive workup including endoscopy. She has been on PPIs without improvement. Given all of her constellation of findings and the only abnormal study being her depressed gallbladder ejection fraction, we discussed options going forward including further observation versus surgery to remove the gallbladder. We discussed that with surgery approximately 1/3 third of biliary dyskinesia patients do have improvement and feel much better, 1/3 feel a little better, and ~ 1/3 are not improved at all and could even be worse particularly with loose stool/diarrhea. -The anatomy and physiology of the hepatobiliary system was discussed at length  with the patient with associated pictures. The pathophysiology of gallbladder disease was discussed at length with associated pictures. -The planned procedure, material risks (including, but not limited to, pain, bleeding, infection, scarring, need for blood transfusion, damage to surrounding structures- blood vessels/nerves/viscus/organs, damage to bile duct, bile leak, need for additional procedures, hernia, pancreatitis, pneumonia, heart attack, stroke, death) benefits and alternatives to surgery were discussed at length. The patient's questions were answered to her satisfaction, she voiced understanding and has elected to proceed with surgery. Additionally, we discussed typical postoperative expectations and the recovery process.  Stephanie Coup. Cliffton Asters, M.D. [facility] Use [email or web address] to contact on call provider

## 2020-08-30 NOTE — Anesthesia Postprocedure Evaluation (Signed)
Anesthesia Post Note  Patient: Paula Holder  Procedure(s) Performed: LAPAROSCOPIC CHOLECYSTECTOMY (N/A )     Patient location during evaluation: PACU Anesthesia Type: General Level of consciousness: sedated and patient cooperative Pain management: pain level controlled Vital Signs Assessment: post-procedure vital signs reviewed and stable Respiratory status: spontaneous breathing Cardiovascular status: stable Anesthetic complications: no   No complications documented.  Last Vitals:  Vitals:   08/30/20 1315 08/30/20 1418  BP: 140/81 (!) 149/76  Pulse: 87 83  Resp: 12 16  Temp: 36.9 C 36.6 C  SpO2: 92% 93%    Last Pain:  Vitals:   08/30/20 1418  TempSrc:   PainSc: 5                  Lewie Loron

## 2020-08-30 NOTE — Anesthesia Preprocedure Evaluation (Addendum)
Anesthesia Evaluation  Patient identified by MRN, date of birth, ID band Patient awake    Reviewed: Allergy & Precautions, NPO status , Patient's Chart, lab work & pertinent test results  Airway Mallampati: II  TM Distance: >3 FB Neck ROM: Full    Dental no notable dental hx. (+) Dental Advisory Given, Teeth Intact   Pulmonary neg pulmonary ROS, former smoker,    Pulmonary exam normal breath sounds clear to auscultation       Cardiovascular hypertension, Pt. on medications Normal cardiovascular exam Rhythm:Regular Rate:Normal     Neuro/Psych negative neurological ROS     GI/Hepatic negative GI ROS, Neg liver ROS,   Endo/Other  negative endocrine ROS  Renal/GU negative Renal ROS     Musculoskeletal negative musculoskeletal ROS (+)   Abdominal   Peds  Hematology negative hematology ROS (+)   Anesthesia Other Findings   Reproductive/Obstetrics                            Anesthesia Physical Anesthesia Plan  ASA: III  Anesthesia Plan: General   Post-op Pain Management:    Induction: Intravenous  PONV Risk Score and Plan: 4 or greater and Ondansetron, Dexamethasone, Treatment may vary due to age or medical condition, Midazolam and Scopolamine patch - Pre-op  Airway Management Planned: Oral ETT  Additional Equipment: None  Intra-op Plan:   Post-operative Plan: Extubation in OR  Informed Consent: I have reviewed the patients History and Physical, chart, labs and discussed the procedure including the risks, benefits and alternatives for the proposed anesthesia with the patient or authorized representative who has indicated his/her understanding and acceptance.     Dental advisory given  Plan Discussed with: CRNA  Anesthesia Plan Comments:        Anesthesia Quick Evaluation

## 2020-08-31 ENCOUNTER — Encounter (HOSPITAL_COMMUNITY): Payer: Self-pay | Admitting: Surgery

## 2020-08-31 LAB — SURGICAL PATHOLOGY

## 2021-05-03 ENCOUNTER — Encounter: Payer: Self-pay | Admitting: Neurology

## 2021-05-03 ENCOUNTER — Other Ambulatory Visit: Payer: Self-pay

## 2021-05-03 ENCOUNTER — Ambulatory Visit: Payer: Federal, State, Local not specified - PPO | Admitting: Neurology

## 2021-05-03 VITALS — BP 134/81 | HR 81 | Ht 67.0 in | Wt 253.5 lb

## 2021-05-03 DIAGNOSIS — R253 Fasciculation: Secondary | ICD-10-CM | POA: Diagnosis not present

## 2021-05-03 MED ORDER — DULOXETINE HCL 30 MG PO CPEP: 30.0000 mg | ORAL_CAPSULE | Freq: Every day | ORAL | 6 refills | Status: AC

## 2021-05-03 NOTE — Progress Notes (Signed)
Chief Complaint  Patient presents with  . New Patient (Initial Visit)    She is having intermittent, muscle twitching throughout body. Also, feels she has decreased grip strength. 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      ASSESSMENT AND PLAN  Paula Holder is a 45 y.o. female   New onset frequent muscle fasciculations, sudden body jerking movement Long history of chronic diffuse body achy pain  Add on Cymbalta 30 mg daily  Get laboratory evaluation from her primary care physician  Need to rule out inflammatory pathology, intrinsic muscle disease, metabolic disarrangement  EMG nerve conduction study    DIAGNOSTIC DATA (LABS, IMAGING, TESTING) - I reviewed patient records, labs, notes, testing and imaging myself where available.   HISTORICAL  Paula Holder is a 45 year old female, seen in request by her primary care nurse practitioner Courtney Paris, for evaluation of muscle fasciculation throughout her body, initial evaluation was June through 2022.  I reviewed and summarized the referring note. PMHx. Chronic pain syndrome, all over, cramps, gabapentin 400mg  qhs. HTN. HLD.  Patient works for , per [employer], she received [employer] vaccination, first dose September 14, 2021, second dose November 2, 3 weeks apart, shortly after second dose, she began to notice intermittent muscle twitching throughout her body, she also described large sudden jerking movement moving her joints, such as sudden arm jerking leg jerking movement  During today's interview, she was noted to have frequent quick jerking movement of right arm to fix her glasses,   Over the past few months, her symptoms has been getting worse, become more frequent, very annoying, sometimes the jerking movement preventing her from going to sleep  She has tried gabapentin, tizanidine, which make her very sleepy, she works 4:30 AM to 9:30  AM, has irregular sleep pattern  PHYSICAL EXAM:   Vitals:   05/03/21 1036  BP: 134/81  Pulse: 81  Weight: 253 lb 8 oz (115 kg)  Height: 5\' 7"  (1.702 m)   Not recorded     Body mass index is 39.7 kg/m.  PHYSICAL EXAMNIATION:  Gen: NAD, conversant, well nourised, well groomed                     Cardiovascular: Regular rate rhythm, no peripheral edema, warm, nontender. Eyes: Conjunctivae clear without exudates or hemorrhage Neck: Supple, no carotid bruits. Pulmonary: Clear to auscultation bilaterally   NEUROLOGICAL EXAM:  MENTAL STATUS: Nervous looking middle-aged female, frequent right arm to correct  her glasses position, Speech:    Speech is normal; fluent and spontaneous with normal comprehension.  Cognition:     Orientation to time, place and person     Normal recent and remote memory     Normal Attention span and concentration     Normal Language, naming, repeating,spontaneous speech     Fund of knowledge   CRANIAL NERVES: CN II: Visual fields are full to confrontation. Pupils are round equal and briskly reactive to light. CN III, IV, VI: extraocular movement are normal. No ptosis. CN V: Facial sensation is intact to light touch CN VII: Face is symmetric with normal eye closure  CN VIII: Hearing is normal to causal conversation. CN IX, X: Phonation is normal. CN XI: Head turning and shoulder shrug are intact  MOTOR: There is no pronator drift of out-stretched arms. Muscle bulk and tone are normal. Muscle strength is normal.  REFLEXES: Reflexes are 2+ and symmetric at the  biceps, triceps, knees, and ankles. Plantar responses are flexor.  SENSORY: Intact to light touch, pinprick and vibratory sensation are intact in fingers and toes.  COORDINATION: There is no trunk or limb dysmetria noted.  GAIT/STANCE: She can get up from seated position arm crossed. Gait is steady with normal steps, base, arm swing, and turning. Heel and toe walking are normal. Tandem  gait is normal.  Romberg is absent.  REVIEW OF SYSTEMS:  Full 14 system review of systems performed and notable only for as above All other review of systems were negative.   ALLERGIES: Allergies  Allergen Reactions  . Bactrim [Sulfamethoxazole-Trimethoprim]     Caused worsening of skin infection  . Buspar [Buspirone] Hives  . Percocet [Oxycodone-Acetaminophen] Hives  . Propofol Hives    HOME MEDICATIONS: Current Outpatient Medications  Medication Sig Dispense Refill  . celecoxib (CELEBREX) 200 MG capsule Take 200 mg by mouth at bedtime.    . gabapentin (NEURONTIN) 400 MG capsule Take 400 mg by mouth at bedtime.    . hydrOXYzine (ATARAX/VISTARIL) 25 MG tablet Take 25 mg by mouth in the morning and at bedtime.    . lidocaine (LIDODERM) 5 % Place 1 patch onto the skin daily as needed for pain. Back pain    . lisinopril (ZESTRIL) 2.5 MG tablet Take 2.5 mg by mouth daily.    Marland Kitchen omeprazole (PRILOSEC) 40 MG capsule Take 40 mg by mouth daily.    . pravastatin (PRAVACHOL) 20 MG tablet Take 20 mg by mouth at bedtime.    . tizanidine (ZANAFLEX) 6 MG capsule Take 6 mg by mouth at bedtime.     . valACYclovir (VALTREX) 500 MG tablet TAKE 1 TABLET BY MOUTH EVERY DAY 90 tablet 1   No current facility-administered medications for this visit.    PAST MEDICAL HISTORY: Past Medical History:  Diagnosis Date  . Acid reflux   . Anxiety   . Chronic pain syndrome   . Family history of adverse reaction to anesthesia    "wake up during surgery"  . Fibromyalgia   . Herpes    Valtrex prn  . Hyperlipemia   . Hypertension   . Muscle twitching   . PCOS (polycystic ovarian syndrome)   . Sleep apnea     PAST SURGICAL HISTORY: Past Surgical History:  Procedure Laterality Date  . CHOLECYSTECTOMY N/A 08/30/2020   Procedure: LAPAROSCOPIC CHOLECYSTECTOMY;  Surgeon: Andria Meuse, MD;  Location: [facility];  Service: General;  Laterality: N/A;  . COLONOSCOPY    . DILATION AND CURETTAGE OF UTERUS     . KNEE SURGERY    . MOUTH SURGERY      FAMILY HISTORY: Family History  Problem Relation Age of Onset  . Hypertension Mother   . Cancer Maternal Grandmother        skin  . Diabetes Paternal Grandmother   . Breast cancer Maternal Aunt   . Heart attack Father     SOCIAL HISTORY: Social History   Socioeconomic History  . Marital status: Single    Spouse name: Not on file  . Number of children: 0  . Years of education: college  . Highest education level: Associate degree: academic program  Occupational History  . Not on file  Tobacco Use  . Smoking status: Former Smoker    Packs/day: 1.00    Years: 15.00    Pack years: 15.00    Types: Cigarettes    Quit date: 08/28/2015    Years since quitting: 5.6  .  Smokeless tobacco: Never Used  Vaping Use  . Vaping Use: Never used  Substance and Sexual Activity  . Alcohol use: No  . Drug use: No  . Sexual activity: Yes    Partners: Male    Birth control/protection: I.U.D.  Other Topics Concern  . Not on file  Social History Narrative   Lives with her boyfriend.   Left-handed.    No daily caffeine use.   Social Determinants of Health   Financial Resource Strain: Not on file  Food Insecurity: Not on file  Transportation Needs: Not on file  Physical Activity: Not on file  Stress: Not on file  Social Connections: Not on file  Intimate Partner Violence: Not on file      Levert Feinstein, M.D. Ph.D.  [facility] [address], [address] [place], [place] [address] Ph: [phone] Fax: [phone]  CC:  Courtney Paris, NP [phone] [address] [place],  [place] [address]  Patient, No Pcp Per (Inactive)

## 2021-06-08 ENCOUNTER — Ambulatory Visit (INDEPENDENT_AMBULATORY_CARE_PROVIDER_SITE_OTHER): Payer: Federal, State, Local not specified - PPO | Admitting: Neurology

## 2021-06-08 ENCOUNTER — Encounter: Payer: Federal, State, Local not specified - PPO | Admitting: Neurology

## 2021-06-08 DIAGNOSIS — Z0289 Encounter for other administrative examinations: Secondary | ICD-10-CM

## 2021-06-08 DIAGNOSIS — R253 Fasciculation: Secondary | ICD-10-CM | POA: Diagnosis not present

## 2021-06-08 NOTE — Procedures (Signed)
Full Name: Paula Holder" Wertheim Gender: Female MRN #: [id number] Date of Birth: 23-Apr-1976    Visit Date: 06/08/2021 09:39 Age: 45 Years Examining Physician: Levert Feinstein, MD  Referring Physician: Levert Feinstein, MD   History: 45 year old female presenting with intermittent bilateral upper and lower extremity muscle jerking, also muscle fasciculation  Summary of the test: Nerve conduction study: Right sural, superficial peroneal, median and ulnar sensory responses were normal.  Right tibial, peroneal to EDB, median and ulnar motor responses were normal.  Electromyography: Selected needle examination of right upper and lower extremity muscles were normal. She reported needle phobia, no paraspinal muscle was needled  Conclusion: This is a normal study.  There is NO electrodiagnostic evidence of large fiber peripheral neuropathy, intrinsic myopathy, right cervical or lumbosacral radiculopathy.    ------------------------------- Levert Feinstein, M.D. PhD  [facility] [address] [place], [place] [address] Tel: [phone] Fax: [phone]  Verbal informed consent was obtained from the patient, patient was informed of potential risk of procedure, including bruising, bleeding, hematoma formation, infection, muscle weakness, muscle pain, numbness, among others.        MNC    Nerve / Sites Muscle Latency Ref. Amplitude Ref. Rel Amp Segments Distance Velocity Ref. Area    ms ms mV mV %  cm m/s m/s mVms  R Median - APB     Wrist APB 3.4 ?4.4 8.2 ?4.0 100 Wrist - APB 7   23.6     Upper arm APB 7.4  7.0  85.4 Upper arm - Wrist 22 55 ?49 20.3  R Ulnar - ADM     Wrist ADM 3.0 ?3.3 6.3 ?6.0 100 Wrist - ADM 7   14.2     B.Elbow ADM 6.6  7.7  122 B.Elbow - Wrist 19 53 ?49 17.9     A.Elbow ADM 8.4  7.8  101 A.Elbow - B.Elbow 10 56 ?49 18.0  R Peroneal - EDB     Ankle EDB 4.7 ?6.5 4.2 ?2.0 100 Ankle - EDB 9   15.7     Fib head EDB 11.0  3.4  80 Fib head - Ankle  29 46 ?44 11.9     Pop fossa EDB 13.2  2.8  84.3 Pop fossa - Fib head 10 46 ?44 10.6         Pop fossa - Ankle      R Tibial - AH     Ankle AH 3.1 ?5.8 6.6 ?4.0 100 Ankle - AH 9   12.3     Pop fossa AH 12.5  4.5  67.7 Pop fossa - Ankle 39 42 ?41 12.6             SNC    Nerve / Sites Rec. Site Peak Lat Ref.  Amp Ref. Segments Distance    ms ms V V  cm  R Sural - Ankle (Calf)     Calf Ankle 3.5 ?4.4 7 ?6 Calf - Ankle 14  R Superficial peroneal - Ankle     Lat leg Ankle 3.6 ?4.4 6 ?6 Lat leg - Ankle 14  R Median - Orthodromic (Dig II, Mid palm)     Dig II Wrist 3.1 ?3.4 14 ?10 Dig II - Wrist 13  R Ulnar - Orthodromic, (Dig V, Mid palm)     Dig V Wrist 2.8 ?3.1 5 ?5 Dig V - Wrist 11  F  Wave    Nerve F Lat Ref.   ms ms  R Tibial - AH 55.7 ?56.0  R Ulnar - ADM 29.4 ?32.0         EMG Summary Table    Spontaneous MUAP Recruitment  Muscle IA Fib PSW Fasc Other Amp Dur. Poly Pattern  R. First dorsal interosseous Normal None None None _______ Normal Normal Normal Normal  R. Deltoid Normal None None None _______ Normal Normal Normal Normal  R. Biceps brachii Normal None None None _______ Normal Normal Normal Normal  R. Vastus lateralis Normal None None None _______ Normal Normal Normal Normal  R. Tibialis posterior Normal None None None _______ Normal Normal Normal Normal  R. Tibialis anterior Normal None None None _______ Normal Normal Normal Normal  R. Peroneus longus Normal None None None _______ Normal Normal Normal Normal  R. Gastrocnemius (Medial head) Normal None None None _______ Normal Normal Normal Normal  R. Brachioradialis Normal None None None _______ Normal Normal Normal Normal  R. Pronator teres Normal None None None _______ Normal Normal Normal Normal
